# Patient Record
Sex: Female | Born: 1970 | Race: White | Hispanic: No | Marital: Married | Smoking: Former smoker
Health system: Southern US, Community
[De-identification: ages and names within clinical notes are randomized; demographics above are authoritative.]

## PROBLEM LIST (undated history)

## (undated) DIAGNOSIS — I10 Essential (primary) hypertension: Secondary | ICD-10-CM

## (undated) DIAGNOSIS — F419 Anxiety disorder, unspecified: Secondary | ICD-10-CM

## (undated) DIAGNOSIS — E079 Disorder of thyroid, unspecified: Secondary | ICD-10-CM

## (undated) HISTORY — PX: ABDOMINAL HYSTERECTOMY: SHX81

## (undated) HISTORY — PX: CHOLECYSTECTOMY: SHX55

## (undated) HISTORY — PX: AUGMENTATION MAMMAPLASTY: SUR837

## (undated) HISTORY — PX: DILATION AND CURETTAGE OF UTERUS: SHX78

---

## 2003-10-08 ENCOUNTER — Emergency Department (HOSPITAL_COMMUNITY): Admission: EM | Admit: 2003-10-08 | Discharge: 2003-10-09 | Payer: Self-pay | Admitting: Emergency Medicine

## 2004-10-26 ENCOUNTER — Ambulatory Visit: Payer: Self-pay | Admitting: Gastroenterology

## 2004-11-06 ENCOUNTER — Ambulatory Visit: Payer: Self-pay | Admitting: Gastroenterology

## 2004-11-06 ENCOUNTER — Encounter (INDEPENDENT_AMBULATORY_CARE_PROVIDER_SITE_OTHER): Payer: Self-pay | Admitting: *Deleted

## 2004-11-06 ENCOUNTER — Ambulatory Visit (HOSPITAL_COMMUNITY): Admission: RE | Admit: 2004-11-06 | Discharge: 2004-11-06 | Payer: Self-pay | Admitting: Gastroenterology

## 2004-11-08 ENCOUNTER — Encounter: Admission: RE | Admit: 2004-11-08 | Discharge: 2004-11-08 | Payer: Self-pay | Admitting: Gynecology

## 2005-05-15 ENCOUNTER — Ambulatory Visit: Payer: Self-pay | Admitting: Gynecology

## 2005-07-01 ENCOUNTER — Ambulatory Visit: Payer: Self-pay | Admitting: Gynecology

## 2007-10-17 ENCOUNTER — Observation Stay: Payer: Self-pay | Admitting: Obstetrics and Gynecology

## 2008-02-20 ENCOUNTER — Emergency Department: Payer: Self-pay | Admitting: Emergency Medicine

## 2008-05-15 ENCOUNTER — Observation Stay: Payer: Self-pay

## 2008-07-20 ENCOUNTER — Observation Stay: Payer: Self-pay | Admitting: Obstetrics and Gynecology

## 2008-07-25 ENCOUNTER — Observation Stay: Payer: Self-pay

## 2008-08-26 ENCOUNTER — Inpatient Hospital Stay: Payer: Self-pay

## 2010-02-01 ENCOUNTER — Ambulatory Visit (HOSPITAL_COMMUNITY)
Admission: RE | Admit: 2010-02-01 | Discharge: 2010-02-01 | Payer: Self-pay | Source: Home / Self Care | Attending: Family Medicine | Admitting: Family Medicine

## 2014-01-11 ENCOUNTER — Ambulatory Visit: Payer: Self-pay | Admitting: Obstetrics and Gynecology

## 2014-01-11 LAB — BASIC METABOLIC PANEL
Anion Gap: 5 — ABNORMAL LOW (ref 7–16)
BUN: 9 mg/dL (ref 7–18)
Calcium, Total: 8.3 mg/dL — ABNORMAL LOW (ref 8.5–10.1)
Chloride: 107 mmol/L (ref 98–107)
Co2: 28 mmol/L (ref 21–32)
Creatinine: 0.6 mg/dL (ref 0.60–1.30)
EGFR (African American): >60
EGFR (Non-African Amer.): >60
Glucose: 77 mg/dL (ref 65–99)
Osmolality: 277 (ref 275–301)
Potassium: 3.8 mmol/L (ref 3.5–5.1)
Sodium: 140 mmol/L (ref 136–145)

## 2014-01-11 LAB — HEMOGLOBIN: HGB: 9 g/dL — ABNORMAL LOW (ref 12.0–16.0)

## 2014-01-13 ENCOUNTER — Observation Stay: Payer: Self-pay | Admitting: Obstetrics and Gynecology

## 2014-01-17 ENCOUNTER — Ambulatory Visit: Payer: Self-pay | Admitting: Obstetrics and Gynecology

## 2014-01-17 LAB — CBC WITH DIFFERENTIAL/PLATELET
Basophil #: 0.1 10*3/uL (ref 0.0–0.1)
Basophil %: 1.1 %
Eosinophil #: 0.1 10*3/uL (ref 0.0–0.7)
Eosinophil %: 1.9 %
HCT: 33.3 % — ABNORMAL LOW (ref 35.0–47.0)
HGB: 10.1 g/dL — ABNORMAL LOW (ref 12.0–16.0)
Lymphocyte #: 1.8 10*3/uL (ref 1.0–3.6)
Lymphocyte %: 27.6 %
MCH: 20.7 pg — ABNORMAL LOW (ref 26.0–34.0)
MCHC: 30.3 g/dL — ABNORMAL LOW (ref 32.0–36.0)
MCV: 68 fL — ABNORMAL LOW (ref 80–100)
Monocyte #: 0.4 x10 3/mm (ref 0.2–0.9)
Monocyte %: 5.8 %
Neutrophil #: 4.1 10*3/uL (ref 1.4–6.5)
Neutrophil %: 63.6 %
Platelet: 247 10*3/uL (ref 150–440)
RBC: 4.88 10*6/uL (ref 3.80–5.20)
RDW: 20.9 % — ABNORMAL HIGH (ref 11.5–14.5)
WBC: 6.5 10*3/uL (ref 3.6–11.0)

## 2014-01-18 LAB — BASIC METABOLIC PANEL
Anion Gap: 5 — ABNORMAL LOW (ref 7–16)
BUN: 5 mg/dL — ABNORMAL LOW (ref 7–18)
Calcium, Total: 7.7 mg/dL — ABNORMAL LOW (ref 8.5–10.1)
Chloride: 110 mmol/L — ABNORMAL HIGH (ref 98–107)
Co2: 28 mmol/L (ref 21–32)
Creatinine: 0.66 mg/dL (ref 0.60–1.30)
EGFR (African American): >60
EGFR (Non-African Amer.): >60
Glucose: 94 mg/dL (ref 65–99)
Osmolality: 282 (ref 275–301)
Potassium: 3.7 mmol/L (ref 3.5–5.1)
Sodium: 143 mmol/L (ref 136–145)

## 2014-01-18 LAB — HEMATOCRIT: HCT: 31.6 % — ABNORMAL LOW (ref 35.0–47.0)

## 2014-02-08 ENCOUNTER — Ambulatory Visit: Payer: Self-pay | Admitting: Obstetrics and Gynecology

## 2014-05-12 ENCOUNTER — Emergency Department
Admit: 2014-05-12 | Disposition: A | Discharge status: Home / Self Care | Payer: Self-pay | Admitting: Emergency Medicine

## 2014-05-12 LAB — CBC
HCT: 42.1 % (ref 35.0–47.0)
HGB: 14 g/dL (ref 12.0–16.0)
MCH: 29.1 pg (ref 26.0–34.0)
MCHC: 33.3 g/dL (ref 32.0–36.0)
MCV: 87 fL (ref 80–100)
Platelet: 168 10*3/uL (ref 150–440)
RBC: 4.82 10*6/uL (ref 3.80–5.20)
RDW: 14.7 % — ABNORMAL HIGH (ref 11.5–14.5)
WBC: 6.8 10*3/uL (ref 3.6–11.0)

## 2014-05-12 LAB — URINALYSIS, COMPLETE
Bilirubin,UR: NEGATIVE
Glucose,UR: NEGATIVE mg/dL (ref 0–75)
Ketone: NEGATIVE
Leukocyte Esterase: NEGATIVE
Nitrite: NEGATIVE
Ph: 6 (ref 4.5–8.0)
Protein: NEGATIVE
Specific Gravity: 1.013 (ref 1.003–1.030)

## 2014-05-12 LAB — BASIC METABOLIC PANEL
Anion Gap: 6 — ABNORMAL LOW (ref 7–16)
BUN: 11 mg/dL
Calcium, Total: 8.8 mg/dL — ABNORMAL LOW
Chloride: 104 mmol/L
Co2: 27 mmol/L
Creatinine: 0.94 mg/dL
EGFR (African American): >60
EGFR (Non-African Amer.): >60
Glucose: 107 mg/dL — ABNORMAL HIGH
Potassium: 3.4 mmol/L — ABNORMAL LOW
Sodium: 137 mmol/L

## 2014-05-12 LAB — MAGNESIUM: Magnesium: 2.1 mg/dL

## 2014-05-12 LAB — TROPONIN I: Troponin-I: <0.03 ng/mL

## 2014-05-12 LAB — CK: CK, Total: 803 U/L — ABNORMAL HIGH

## 2014-05-23 LAB — SURGICAL PATHOLOGY

## 2014-05-25 NOTE — Op Note (Signed)
PATIENT NAME:  Colleen Phillips, Colleen Phillips MR#:  [id number] DATE OF BIRTH:  08/29/1970  DATE OF PROCEDURE:  01/17/2014  PREOPERATIVE DIAGNOSIS:  1.  Menorrhagia.  2.  Endometrial polyp.  3.  Anemia.   POSTOPERATIVE DIAGNOSES:  1.  Menorrhagia.  2.  Endometrial polyp.  3.  Anemia.   PROCEDURE:  1.  Total vaginal hysterectomy.  2.  Bilateral salpingectomy.  ANESTHESIA:  General endotracheal anesthesia.   SURGEON: Boykin Nearing, M.D.   FIRST ASSISTANT:  Dr. Leafy Ro.   INDICATIONS: This is a 44 year old, gravida 8, para three patient with a significant menorrhagia failing conservative treatment with IUD and birth control pills. The patient was noted to have a 2 x 1 cm endometrial polyp on saline infusion sonohysterography and elects for definitive treatment. The patient did receive 1 unit of blood transfusion three days prior to the procedure given her hemoglobin of 9.   PROCEDURE: After adequate general endotracheal anesthesia, the patient was placed in the dorsal supine position with legs in the candy cane stirrups. The patient's abdomen, perineum, and vagina were prepped and draped in normal sterile fashion. The patient did receive 2 grams IV cefoxitin prior to commencement of the case. Straight catheterization of the bladder yielded 100 mL clear urine. A weighted speculum was placed in the posterior vaginal vault. The cervix was grasped with two thyroid tenacula, and the cervix was circumferentially injected with 1% lidocaine with 1:100,000 epinephrine. A direct posterior colpotomy incision was made upon entry into the posterior cul-de-sac. The uterosacral ligaments were bilaterally clamped, transected, and suture ligated with 0 Vicryl suture and tagged for later identification. Anterior cervix was circumferentially incised with the Bovie. The anterior cul-de-sac was entered without difficulty and a Deaver retractor was placed within to elevate the bladder anteriorly. The cardinal ligaments were  bilaterally clamped, transected, and suture ligated with 0 Vicryl suture followed by bilaterally clamping the uterine arteries, transected, and suture ligated with 0 Vicryl suture. Serial bites in the broad ligament were performed, and suture ligated with 0 Vicryl suture. The central portion of the uterus was cored out given the width of the uterus and the cornua then were bilaterally clamped, transected, and suture ligated.  The uterus was delivered off the operative field with a noted endometrial polyp seen. Each fallopian tube was grasped with a Babcock clamp and clamped with a Heaney clamp and removed and suture ligated with 0 Vicryl suture. Ovaries appeared normal. Hemostasis was good and the peritoneum was then closed with a 2-0 PDS suture in a pursestring fashion and the vaginal cuff was then closed with 0 Vicryl suture. The uterosacral ligaments were plicated centrally and the rest of the vaginal vault was closed. Good hemostasis was noted. Foley catheter was placed at the end of the case yielding clear urine. There were no complications.   ESTIMATED BLOOD LOSS:  One hundred and twenty-five milliliters.  Intraoperative fluids: 900 mL, lactated Ringer's and the patient did receive 1 gram of IV Tylenol while still in the Operating Room.      ____________________________ Boykin Nearing, MD tjs:at D: 01/17/2014 10:00:55 ET T: 01/17/2014 12:14:27 ET JOB#: [id number]  cc: Boykin Nearing, MD, <Dictator> Boykin Nearing MD ELECTRONICALLY SIGNED 01/31/2014 12:15

## 2014-07-08 ENCOUNTER — Other Ambulatory Visit: Payer: Self-pay | Admitting: Otolaryngology

## 2014-07-08 DIAGNOSIS — R220 Localized swelling, mass and lump, head: Secondary | ICD-10-CM

## 2014-07-08 DIAGNOSIS — R221 Localized swelling, mass and lump, neck: Principal | ICD-10-CM

## 2014-07-20 ENCOUNTER — Other Ambulatory Visit: Payer: Self-pay | Admitting: Otolaryngology

## 2014-07-20 DIAGNOSIS — E041 Nontoxic single thyroid nodule: Secondary | ICD-10-CM

## 2014-07-20 DIAGNOSIS — R221 Localized swelling, mass and lump, neck: Principal | ICD-10-CM

## 2014-07-20 DIAGNOSIS — R220 Localized swelling, mass and lump, head: Secondary | ICD-10-CM

## 2014-07-21 ENCOUNTER — Ambulatory Visit
Admission: RE | Admit: 2014-07-21 | Discharge: 2014-07-21 | Disposition: A | Discharge status: Home / Self Care | Payer: Self-pay | Source: Ambulatory Visit | Attending: Otolaryngology | Admitting: Otolaryngology

## 2014-07-21 DIAGNOSIS — R221 Localized swelling, mass and lump, neck: Secondary | ICD-10-CM | POA: Insufficient documentation

## 2014-07-21 DIAGNOSIS — E041 Nontoxic single thyroid nodule: Secondary | ICD-10-CM

## 2014-07-21 DIAGNOSIS — R220 Localized swelling, mass and lump, head: Secondary | ICD-10-CM

## 2014-09-03 ENCOUNTER — Emergency Department: Payer: BC Managed Care – PPO

## 2014-09-03 ENCOUNTER — Emergency Department
Admission: EM | Admit: 2014-09-03 | Discharge: 2014-09-03 | Disposition: A | Discharge status: Home / Self Care | Payer: BC Managed Care – PPO | Attending: Emergency Medicine | Admitting: Emergency Medicine

## 2014-09-03 DIAGNOSIS — R52 Pain, unspecified: Secondary | ICD-10-CM

## 2014-09-03 DIAGNOSIS — Y92331 Roller skating rink as the place of occurrence of the external cause: Secondary | ICD-10-CM | POA: Insufficient documentation

## 2014-09-03 DIAGNOSIS — Y998 Other external cause status: Secondary | ICD-10-CM | POA: Diagnosis not present

## 2014-09-03 DIAGNOSIS — S76302A Unspecified injury of muscle, fascia and tendon of the posterior muscle group at thigh level, left thigh, initial encounter: Secondary | ICD-10-CM

## 2014-09-03 DIAGNOSIS — Y9351 Activity, roller skating (inline) and skateboarding: Secondary | ICD-10-CM | POA: Diagnosis not present

## 2014-09-03 DIAGNOSIS — T148XXA Other injury of unspecified body region, initial encounter: Secondary | ICD-10-CM

## 2014-09-03 DIAGNOSIS — S8992XA Unspecified injury of left lower leg, initial encounter: Secondary | ICD-10-CM | POA: Diagnosis present

## 2014-09-03 DIAGNOSIS — S76312A Strain of muscle, fascia and tendon of the posterior muscle group at thigh level, left thigh, initial encounter: Secondary | ICD-10-CM | POA: Insufficient documentation

## 2014-09-03 MED ORDER — OXYCODONE-ACETAMINOPHEN 5-325 MG PO TABS: 1.0000 | ORAL_TABLET | Freq: Four times a day (QID) | ORAL | Status: DC | PRN

## 2014-09-03 MED ORDER — IBUPROFEN 800 MG PO TABS: 800.0000 mg | ORAL_TABLET | Freq: Once | ORAL | Status: DC

## 2014-09-03 MED ORDER — IBUPROFEN 800 MG PO TABS: 800.0000 mg | ORAL_TABLET | Freq: Three times a day (TID) | ORAL | Status: DC | PRN

## 2014-09-03 MED ORDER — NAPROXEN 500 MG PO TABS: 500.0000 mg | ORAL_TABLET | Freq: Two times a day (BID) | ORAL | Status: AC

## 2014-09-03 MED ORDER — OXYCODONE-ACETAMINOPHEN 5-325 MG PO TABS
1.0000 | ORAL_TABLET | Freq: Once | ORAL | Status: AC
  Administered 2014-09-03: 1 via ORAL
  Filled 2014-09-03: qty 1

## 2014-09-03 NOTE — ED Notes (Signed)
Patient was skating on Thursday and did a split. Felt a pop in her left thigh. Has knot behind knee and under buttock. Painful in groin area. States swelling. States that if she steps a certain way, pain shoots up from ankle to knee.

## 2014-09-03 NOTE — ED Provider Notes (Signed)
[facility] Emergency Department Provider Note  ____________________________________________  Time seen: Approximately 8:18 PM  I have reviewed the triage vital signs and the nursing notes.   HISTORY  Chief Complaint Leg Pain    HPI GLYNNIS GAVEL is a 44 y.o. female who fell while rollerskating 2 nights ago injuring the back of her left leg. She heard a pop. She's had swelling and tingling in the left leg. No visible bruising at this point. No injury to the knee or ankle. Pain is primarily in the posterior thigh   No past medical history on file.  There are no active problems to display for this patient.   No past surgical history on file.  Current Outpatient Rx  Name  Route  Sig  Dispense  Refill  . naproxen (NAPROSYN) 500 MG tablet   Oral   Take 1 tablet (500 mg total) by mouth 2 (two) times daily with a meal.   40 tablet   2   . oxyCODONE-acetaminophen (ROXICET) 5-325 MG per tablet   Oral   Take 1 tablet by mouth every 6 (six) hours as needed.   20 tablet   0     Allergies Review of patient's allergies indicates not on file.  No family history on file.  Social History History  Substance Use Topics  . Smoking status: Not on file  . Smokeless tobacco: Not on file  . Alcohol Use: Not on file    Review of Systems Constitutional: No fever/chills Eyes: No visual changes. ENT: No sore throat. Cardiovascular: Denies chest pain. Respiratory: Denies shortness of breath. Gastrointestinal: No abdominal pain.  No nausea, no vomiting.  No diarrhea.  No constipation. Genitourinary: Negative for dysuria. Musculoskeletal: Negative for back pain. See above Skin: Negative for rash. Neurological: Negative for headaches, focal weakness or numbness.  10-point ROS otherwise negative.  ____________________________________________   PHYSICAL EXAM:  VITAL SIGNS: ED Triage Vitals  Enc Vitals Group     BP 09/03/14 1736 134/65 mmHg     Pulse  Rate 09/03/14 1734 76     Resp 09/03/14 1734 16     Temp 09/03/14 1734 98 F (36.7 C)     Temp Source 09/03/14 1734 Oral     SpO2 09/03/14 1734 99 %     Weight 09/03/14 1734 170 lb (77.111 kg)     Height 09/03/14 1734 5\' 6"  (1.676 m)     Head Cir --      Peak Flow --      Pain Score 09/03/14 1731 6     Pain Loc --      Pain Edu? --      Excl. in [place]? --     Constitutional: Alert and oriented. Well appearing and in no acute distress. Eyes: Conjunctivae are normal. PERRL. EOMI. Head: Atraumatic. Nose: No congestion/rhinnorhea. Mouth/Throat: Mucous membranes are moist.  Oropharynx non-erythematous. Neck: supple Cardiovascular: Normal rate, regular rhythm. Grossly normal heart sounds.  Good peripheral circulation. Respiratory: Normal respiratory effort.  No retractions. Lungs CTAB. Gastrointestinal: Soft and nontender. No distention. No abdominal bruits. No CVA tenderness. Musculoskeletal: Tenderness with swelling in the left posterior thigh, proximally and distally. No visible bruising noted. Pain with knee flexion and extension, in the posterior thigh. Tender in the left buttocks as well. Pain with internal and external rotation of the hip joint. Neurologic:  Normal speech and language. No gross focal neurologic deficits are appreciated. No gait instability. Skin:  Skin is warm, dry and intact. No  rash noted. Psychiatric: Mood and affect are normal. Speech and behavior are normal.  ____________________________________________   LABS (all labs ordered are listed, but only abnormal results are displayed)  Labs Reviewed - No data to display ____________________________________________  EKG    ____________________________________________  RADIOLOGY  EXAM: LEFT FEMUR 2 VIEWS  COMPARISON: None.  FINDINGS: There is no evidence of fracture or dislocation. The left femur appears grossly intact. The left femoral head remains seated at the acetabulum. The knee joint is  grossly unremarkable in appearance. No knee joint effusion is identified. Slight lucency noted within the proximal tibia on a single view is thought to be artifactual in nature.  The left sacroiliac joint is grossly unremarkable. No definite soft tissue abnormalities are characterized on radiograph.  IMPRESSION: No evidence of fracture or dislocation. Slight lucency within the proximal tibia on a single view is thought to be artifactual in nature. However, if the patient has significant focal knee symptoms, dedicated knee radiographs could be considered for further evaluation.   Electronically Signed  By: Garald Balding M.D.  On: 09/03/2014 19:49 ____________________________________________   PROCEDURES  Procedure(s) performed: None  Critical Care performed: No  ____________________________________________   INITIAL IMPRESSION / ASSESSMENT AND PLAN / ED COURSE  Pertinent labs & imaging results that were available during my care of the patient were reviewed by me and considered in my medical decision making (see chart for details).  44 year old female with injury to her left hamstring. Suspect a strain with possible significant tear. Muscle function is intact, though painful. Encouraged naproxen, ice, Ace wrap for compression, and use of crutches for support. She will elevate the leg. Follow-up with orthopedist next week. She is given naproxen and Percocet for pain.  ____________________________________________   FINAL CLINICAL IMPRESSION(S) / ED DIAGNOSES  Final diagnoses:  Hamstring injury, left, initial encounter  Muscle strain      Mortimer Fries, PA-C 09/03/14 2024  Carrie Mew, MD 09/03/14 [phone]

## 2014-09-03 NOTE — ED Notes (Signed)
Patient to ED with c/o left upper leg pain, reports roller skating Thursday and fell doing a complete split. Reports since that time the pain has been significant.

## 2014-09-03 NOTE — Discharge Instructions (Signed)
Muscle Strain A muscle strain is an injury that occurs when a muscle is stretched beyond its normal length. Usually a small number of muscle fibers are torn when this happens. Muscle strain is rated in degrees. First-degree strains have the least amount of muscle fiber tearing and pain. Second-degree and third-degree strains have increasingly more tearing and pain.  Usually, recovery from muscle strain takes 1-2 weeks. Complete healing takes 5-6 weeks.  CAUSES  Muscle strain happens when a sudden, violent force placed on a muscle stretches it too far. This may occur with lifting, sports, or a fall.  RISK FACTORS Muscle strain is especially common in athletes.  SIGNS AND SYMPTOMS At the site of the muscle strain, there may be:  Pain.  Bruising.  Swelling.  Difficulty using the muscle due to pain or lack of normal function. DIAGNOSIS  Your health care provider will perform a physical exam and ask about your medical history. TREATMENT  Often, the best treatment for a muscle strain is resting, icing, and applying cold compresses to the injured area.  HOME CARE INSTRUCTIONS   Use the PRICE method of treatment to promote muscle healing during the first 2-3 days after your injury. The PRICE method involves:  Protecting the muscle from being injured again.  Restricting your activity and resting the injured body part.  Icing your injury. To do this, put ice in a plastic bag. Place a towel between your skin and the bag. Then, apply the ice and leave it on from 15-20 minutes each hour. After the third day, switch to moist heat packs.  Apply compression to the injured area with a splint or elastic bandage. Be careful not to wrap it too tightly. This may interfere with blood circulation or increase swelling.  Elevate the injured body part above the level of your heart as often as you can.  Only take over-the-counter or prescription medicines for pain, discomfort, or fever as directed by your  health care provider.  Warming up prior to exercise helps to prevent future muscle strains. SEEK MEDICAL CARE IF:   You have increasing pain or swelling in the injured area.  You have numbness, tingling, or a significant loss of strength in the injured area. MAKE SURE YOU:   Understand these instructions.  Will watch your condition.  Will get help right away if you are not doing well or get worse. Document Released: 01/14/2005 Document Revised: 11/04/2012 Document Reviewed: 08/13/2012 [facility] Patient Information 2015 [place], [place]. This information is not intended to replace advice given to you by your health care provider. Make sure you discuss any questions you have with your health care provider.   Continue the Ace wrap and ice to the area. Use pain medicine as directed. Keep area elevated. Contact the orthopedist next week for follow-up appointment.

## 2015-01-06 ENCOUNTER — Other Ambulatory Visit: Payer: Self-pay | Admitting: Family Medicine

## 2015-01-08 NOTE — Telephone Encounter (Signed)
Refilled for 30 days. Must schedule recheck appointment before further refills needed.

## 2015-01-09 NOTE — Telephone Encounter (Signed)
Left patient a voicemail advising her that refill has been sent to pharmacy, and to call back to schedule a follow up appointment for further refills.

## 2016-03-15 ENCOUNTER — Emergency Department: Payer: BC Managed Care – PPO

## 2016-03-15 ENCOUNTER — Emergency Department
Admission: EM | Admit: 2016-03-15 | Discharge: 2016-03-16 | Disposition: A | Discharge status: Home / Self Care | Payer: BC Managed Care – PPO | Attending: Student in an Organized Health Care Education/Training Program | Admitting: Student in an Organized Health Care Education/Training Program

## 2016-03-15 ENCOUNTER — Encounter: Payer: Self-pay | Admitting: Emergency Medicine

## 2016-03-15 DIAGNOSIS — N200 Calculus of kidney: Secondary | ICD-10-CM | POA: Insufficient documentation

## 2016-03-15 DIAGNOSIS — I10 Essential (primary) hypertension: Secondary | ICD-10-CM | POA: Insufficient documentation

## 2016-03-15 DIAGNOSIS — F17200 Nicotine dependence, unspecified, uncomplicated: Secondary | ICD-10-CM | POA: Insufficient documentation

## 2016-03-15 DIAGNOSIS — R1031 Right lower quadrant pain: Secondary | ICD-10-CM

## 2016-03-15 DIAGNOSIS — N201 Calculus of ureter: Secondary | ICD-10-CM

## 2016-03-15 HISTORY — DX: Essential (primary) hypertension: I10

## 2016-03-15 HISTORY — DX: Anxiety disorder, unspecified: F41.9

## 2016-03-15 HISTORY — DX: Disorder of thyroid, unspecified: E07.9

## 2016-03-15 LAB — BASIC METABOLIC PANEL
Anion gap: 9 (ref 5–15)
BUN: 11 mg/dL (ref 6–20)
CO2: 25 mmol/L (ref 22–32)
Calcium: 9 mg/dL (ref 8.9–10.3)
Chloride: 107 mmol/L (ref 101–111)
Creatinine, Ser: 0.59 mg/dL (ref 0.44–1.00)
GFR calc Af Amer: >60 mL/min (ref >60)
GFR calc non Af Amer: >60 mL/min (ref >60)
Glucose, Bld: 127 mg/dL — ABNORMAL HIGH (ref 65–99)
Potassium: 3.6 mmol/L (ref 3.5–5.1)
Sodium: 141 mmol/L (ref 135–145)

## 2016-03-15 LAB — CBC
HCT: 43.2 % (ref 35.0–47.0)
HEMOGLOBIN: 15.1 g/dL (ref 12.0–16.0)
MCH: 30.7 pg (ref 26.0–34.0)
MCHC: 35 g/dL (ref 32.0–36.0)
MCV: 87.8 fL (ref 80.0–100.0)
Platelets: 199 10*3/uL (ref 150–440)
RBC: 4.91 MIL/uL (ref 3.80–5.20)
RDW: 13.7 % (ref 11.5–14.5)
WBC: 11 10*3/uL (ref 3.6–11.0)

## 2016-03-15 LAB — URINALYSIS, COMPLETE (UACMP) WITH MICROSCOPIC
Bilirubin Urine: NEGATIVE
Glucose, UA: NEGATIVE mg/dL
Ketones, ur: NEGATIVE mg/dL
Leukocytes, UA: NEGATIVE
Nitrite: POSITIVE — AB
Protein, ur: NEGATIVE mg/dL
Specific Gravity, Urine: 1.019 (ref 1.005–1.030)
pH: 5 (ref 5.0–8.0)

## 2016-03-15 MED ORDER — KETOROLAC TROMETHAMINE 30 MG/ML IJ SOLN
30.0000 mg | Freq: Once | INTRAMUSCULAR | Status: AC
  Administered 2016-03-15: 30 mg via INTRAVENOUS
  Filled 2016-03-15: qty 1

## 2016-03-15 MED ORDER — DEXTROSE 5 % IV SOLN: 1.0000 g | Freq: Once | INTRAVENOUS | Status: DC

## 2016-03-15 MED ORDER — PROMETHAZINE HCL 12.5 MG PO TABS: 12.5000 mg | ORAL_TABLET | Freq: Four times a day (QID) | ORAL | 0 refills | Status: DC | PRN

## 2016-03-15 MED ORDER — HYDROCODONE-ACETAMINOPHEN 5-325 MG PO TABS: 1.0000 | ORAL_TABLET | ORAL | 0 refills | Status: DC | PRN

## 2016-03-15 MED ORDER — ONDANSETRON HCL 4 MG/2ML IJ SOLN
4.0000 mg | Freq: Once | INTRAMUSCULAR | Status: AC
  Administered 2016-03-15: 4 mg via INTRAVENOUS
  Filled 2016-03-15: qty 2

## 2016-03-15 MED ORDER — CEPHALEXIN 500 MG PO CAPS: 500.0000 mg | ORAL_CAPSULE | Freq: Three times a day (TID) | ORAL | 0 refills | Status: AC

## 2016-03-15 MED ORDER — MORPHINE SULFATE (PF) 4 MG/ML IV SOLN
4.0000 mg | INTRAVENOUS | Status: DC | PRN
  Administered 2016-03-15 (×2): 4 mg via INTRAVENOUS
  Filled 2016-03-15 (×2): qty 1

## 2016-03-15 MED ORDER — CEFTRIAXONE SODIUM-DEXTROSE 1-3.74 GM-% IV SOLR
1.0000 g | Freq: Once | INTRAVENOUS | Status: AC
  Administered 2016-03-15: 1 g via INTRAVENOUS
  Filled 2016-03-15: qty 50

## 2016-03-15 MED ORDER — TAMSULOSIN HCL 0.4 MG PO CAPS: 0.4000 mg | ORAL_CAPSULE | Freq: Every day | ORAL | 0 refills | Status: DC

## 2016-03-15 MED ORDER — NAPROXEN 375 MG PO TABS: 375.0000 mg | ORAL_TABLET | Freq: Two times a day (BID) | ORAL | 0 refills | Status: AC

## 2016-03-15 MED ORDER — PROMETHAZINE HCL 25 MG/ML IJ SOLN: 12.5000 mg | Freq: Once | INTRAMUSCULAR | Status: DC

## 2016-03-15 MED ORDER — SODIUM CHLORIDE 0.9 % IV BOLUS (SEPSIS)
1000.0000 mL | Freq: Once | INTRAVENOUS | Status: AC
  Administered 2016-03-15: 1000 mL via INTRAVENOUS

## 2016-03-15 MED ORDER — FENTANYL CITRATE (PF) 100 MCG/2ML IJ SOLN
50.0000 ug | INTRAMUSCULAR | Status: DC | PRN
  Administered 2016-03-15: 50 ug via INTRAVENOUS
  Filled 2016-03-15: qty 2

## 2016-03-15 MED ORDER — IOPAMIDOL (ISOVUE-300) INJECTION 61%
100.0000 mL | Freq: Once | INTRAVENOUS | Status: AC | PRN
  Administered 2016-03-15: 100 mL via INTRAVENOUS

## 2016-03-15 NOTE — ED Provider Notes (Signed)
[facility] Emergency Department Provider Note    First MD Initiated Contact with Patient 03/15/16 2028     (approximate)  I have reviewed the triage vital signs and the nursing notes.   HISTORY  Chief Complaint Pelvic Pain    HPI Colleen Phillips is a 46 y.o. female presents with acute right lower quadrant pain radiating into her suprapubic area. States that he's been having symptoms of a UTI for the past week. Called a tele-physician who prescribed Macrobid but the symptoms have not improved since then. States she does have a history of kidney stone but this feels different. Is status post hysterectomy but still has her appendix as far she knows. States that the pain has become more severe today associated with chills but no nausea or vomiting. Denies any dysuria. No change in color. Denies any measured fevers.   Past Medical History:  Diagnosis Date  . Anxiety   . Hypertension   . Thyroid disease    No family history on file. Past Surgical History:  Procedure Laterality Date  . ABDOMINAL HYSTERECTOMY    . CHOLECYSTECTOMY    . DILATION AND CURETTAGE OF UTERUS     There are no active problems to display for this patient.     Prior to Admission medications   Medication Sig Start Date End Date Taking? Authorizing Provider  metoprolol succinate (TOPROL-XL) 25 MG 24 hr tablet TAKE ONE TABLET BY MOUTH ONCE DAILY 01/08/15   Vickki Muff Chrismon, PA  oxyCODONE-acetaminophen (ROXICET) 5-325 MG per tablet Take 1 tablet by mouth every 6 (six) hours as needed. 09/03/14   Mortimer Fries, PA-C    Allergies Doxycycline    Social History Social History  Substance Use Topics  . Smoking status: Current Some Day Smoker  . Smokeless tobacco: Never Used  . Alcohol use Yes     Comment: Social     Review of Systems Patient denies headaches, rhinorrhea, blurry vision, numbness, shortness of breath, chest pain, edema, cough, abdominal pain, nausea, vomiting,  diarrhea, dysuria, fevers, rashes or hallucinations unless otherwise stated above in HPI. ____________________________________________   PHYSICAL EXAM:  VITAL SIGNS: Vitals:   03/15/16 2011 03/15/16 2035  BP: (!) 133/107 137/83  Pulse: 98 89  Resp: 20 18  Temp: 98.5 F (36.9 C)     Constitutional: Alert and oriented. Well appearing and in no acute distress. Eyes: Conjunctivae are normal. PERRL. EOMI. Head: Atraumatic. Nose: No congestion/rhinnorhea. Mouth/Throat: Mucous membranes are moist.  Oropharynx non-erythematous. Neck: No stridor. Painless ROM. No cervical spine tenderness to palpation Hematological/Lymphatic/Immunilogical: No cervical lymphadenopathy. Cardiovascular: Normal rate, regular rhythm. Grossly normal heart sounds.  Good peripheral circulation. Respiratory: Normal respiratory effort.  No retractions. Lungs CTAB. Gastrointestinal: Soft with acute ttp in RLQ with rebound tenderness.No distention. No abdominal bruits. No CVA tenderness. Musculoskeletal: No lower extremity tenderness nor edema.  No joint effusions. Neurologic:  Normal speech and language. No gross focal neurologic deficits are appreciated. No gait instability. Skin:  Skin is warm, dry and intact. No rash noted. Psychiatric: Mood and affect are normal. Speech and behavior are normal.  ____________________________________________   LABS (all labs ordered are listed, but only abnormal results are displayed)  Results for orders placed or performed during the hospital encounter of 03/15/16 (from the past 24 hour(s))  Urinalysis, Complete w Microscopic     Status: Abnormal   Collection Time: 03/15/16  8:18 PM  Result Value Ref Range   Color, Urine AMBER (A) YELLOW  APPearance CLEAR (A) CLEAR   Specific Gravity, Urine 1.019 1.005 - 1.030   pH 5.0 5.0 - 8.0   Glucose, UA NEGATIVE NEGATIVE mg/dL   Hgb urine dipstick MODERATE (A) NEGATIVE   Bilirubin Urine NEGATIVE NEGATIVE   Ketones, ur NEGATIVE  NEGATIVE mg/dL   Protein, ur NEGATIVE NEGATIVE mg/dL   Nitrite POSITIVE (A) NEGATIVE   Leukocytes, UA NEGATIVE NEGATIVE   RBC / HPF TOO NUMEROUS TO COUNT 0 - 5 RBC/hpf   WBC, UA 0-5 0 - 5 WBC/hpf   Bacteria, UA FEW (A) NONE SEEN   Squamous Epithelial / LPF 0-5 (A) NONE SEEN   Mucous PRESENT   Basic metabolic panel     Status: Abnormal   Collection Time: 03/15/16  8:27 PM  Result Value Ref Range   Sodium 141 135 - 145 mmol/L   Potassium 3.6 3.5 - 5.1 mmol/L   Chloride 107 101 - 111 mmol/L   CO2 25 22 - 32 mmol/L   Glucose, Bld 127 (H) 65 - 99 mg/dL   BUN 11 6 - 20 mg/dL   Creatinine, Ser 0.59 0.44 - 1.00 mg/dL   Calcium 9.0 8.9 - 10.3 mg/dL   GFR calc non Af Amer >60 >60 mL/min   GFR calc Af Amer >60 >60 mL/min   Anion gap 9 5 - 15  CBC     Status: None   Collection Time: 03/15/16  8:27 PM  Result Value Ref Range   WBC 11.0 3.6 - 11.0 K/uL   RBC 4.91 3.80 - 5.20 MIL/uL   Hemoglobin 15.1 12.0 - 16.0 g/dL   HCT 43.2 35.0 - 47.0 %   MCV 87.8 80.0 - 100.0 fL   MCH 30.7 26.0 - 34.0 pg   MCHC 35.0 32.0 - 36.0 g/dL   RDW 13.7 11.5 - 14.5 %   Platelets 199 150 - 440 K/uL   ____________________________________________  EKG_________________________________  RADIOLOGY  I personally reviewed all radiographic images ordered to evaluate for the above acute complaints and reviewed radiology reports and findings.  These findings were personally discussed with the patient.  Please see medical record for radiology report.  ____________________________________________   PROCEDURES  Procedure(s) performed:  Procedures    Critical Care performed: no ____________________________________________   INITIAL IMPRESSION / ASSESSMENT AND PLAN / ED COURSE  Pertinent labs & imaging results that were available during my care of the patient were reviewed by me and considered in my medical decision making (see chart for details).  DDX: appy, stone, pyelo, uti, cystitis, urethritis,  hernia, torsion  Colleen Phillips is a 46 y.o. who presents to the ED with acute right lower quadrant pain as described above. Patient afebrile and hemodynamically stable. Urinalysis does show evidence of blood with nitrate positive concerning for UTI the patient denies any dysuria and has been on Macrobid. She does have acute right lower quadrant tenderness palpation and I am concerned for appendicitis therefore will order CT imaging of further evaluate. We'll provide IV pain medication and IV fluids.  The patient will be placed on continuous pulse oximetry and telemetry for monitoring.  Laboratory evaluation will be sent to evaluate for the above complaints.     Clinical Course as of Mar 16 2307  Fri Mar 15, 2016  2304 Patient reassessed. Remains afebrile. Otherwise hemodynamic stable. There is no evidence of hydronephrosis or obstruction on her CT scan. Urinalysis on further review only nitrite positive and in the setting of taking Pyridium may be falsely positive. She  again she denies any dysuria or fevers therefore a lower suspicion for septic stone but will continue with therapy with antibiotics that she was recently treated with Macrobid. Patient able to tolerate oral hydration and otherwise hemodynamic stable. A spoke with Dr. Roni Bread of urology who agrees that there is no indication for emergent intervention and that she is likely secondary to nephrolithiasis. Did recommend admission for pain control with the patient states she preferred discharge home. She is otherwise hemodynamically stable I do feel she is appropriate for close outpatient follow-up.  Have discussed with the patient and available family all diagnostics and treatments performed thus far and all questions were answered to the best of my ability. The patient demonstrates understanding and agreement with plan.   [PR]    Clinical Course User Index [PR] Merlyn Lot, MD     ____________________________________________   FINAL  CLINICAL IMPRESSION(S) / ED DIAGNOSES  Final diagnoses:  Ureterolithiasis  RLQ abdominal pain      NEW MEDICATIONS STARTED DURING THIS VISIT:  New Prescriptions   No medications on file     Note:  This document was prepared using Dragon voice recognition software and may include unintentional dictation errors.    Merlyn Lot, MD 03/15/16 [phone]

## 2016-03-15 NOTE — ED Notes (Signed)
Patient given crackers and water for PO challenge. Will continue to monitor.

## 2016-03-15 NOTE — ED Triage Notes (Signed)
Patient presents to ED via POV with c/o pelvic pain. Patient points to right groin. Patient states, "There is so much pressure. It's like I am having a baby. I want to push something out. Its like a contraction". Hx of hysterotomy. Patient taking microbid from "a doctor on demand". Patient denies dysuria.

## 2016-03-16 NOTE — ED Notes (Signed)
Pt tolerated PO challenge without difficulty.  

## 2016-03-17 LAB — URINE CULTURE: Culture: <10000 — AB

## 2017-04-09 ENCOUNTER — Other Ambulatory Visit: Payer: Self-pay | Admitting: Internal Medicine

## 2017-04-09 DIAGNOSIS — R002 Palpitations: Secondary | ICD-10-CM

## 2017-04-09 DIAGNOSIS — I208 Other forms of angina pectoris: Secondary | ICD-10-CM

## 2017-04-10 ENCOUNTER — Other Ambulatory Visit: Payer: Self-pay | Admitting: Internal Medicine

## 2017-04-10 DIAGNOSIS — R002 Palpitations: Secondary | ICD-10-CM

## 2017-04-10 DIAGNOSIS — I208 Other forms of angina pectoris: Secondary | ICD-10-CM

## 2017-04-14 ENCOUNTER — Ambulatory Visit
Admission: RE | Admit: 2017-04-14 | Discharge: 2017-04-14 | Disposition: A | Discharge status: Home / Self Care | Payer: BC Managed Care – PPO | Source: Ambulatory Visit | Attending: Internal Medicine | Admitting: Internal Medicine

## 2017-04-14 DIAGNOSIS — R002 Palpitations: Secondary | ICD-10-CM | POA: Insufficient documentation

## 2017-04-14 DIAGNOSIS — R011 Cardiac murmur, unspecified: Secondary | ICD-10-CM | POA: Insufficient documentation

## 2017-04-14 DIAGNOSIS — I10 Essential (primary) hypertension: Secondary | ICD-10-CM | POA: Insufficient documentation

## 2017-04-14 DIAGNOSIS — E079 Disorder of thyroid, unspecified: Secondary | ICD-10-CM | POA: Insufficient documentation

## 2017-04-14 DIAGNOSIS — Z7689 Persons encountering health services in other specified circumstances: Secondary | ICD-10-CM | POA: Insufficient documentation

## 2017-04-14 DIAGNOSIS — I208 Other forms of angina pectoris: Secondary | ICD-10-CM | POA: Insufficient documentation

## 2017-04-14 DIAGNOSIS — F419 Anxiety disorder, unspecified: Secondary | ICD-10-CM | POA: Insufficient documentation

## 2017-04-14 NOTE — Progress Notes (Signed)
*  PRELIMINARY RESULTS* Echocardiogram 2D Echocardiogram has been performed.  Colleen Phillips 04/14/2017, 12:01 PM

## 2017-08-18 ENCOUNTER — Encounter: Payer: Self-pay | Admitting: Intensive Care

## 2017-08-18 ENCOUNTER — Emergency Department
Admission: EM | Admit: 2017-08-18 | Discharge: 2017-08-18 | Disposition: A | Discharge status: Home / Self Care | Payer: Self-pay | Attending: Emergency Medicine | Admitting: Emergency Medicine

## 2017-08-18 ENCOUNTER — Emergency Department: Payer: Self-pay

## 2017-08-18 ENCOUNTER — Other Ambulatory Visit: Payer: Self-pay

## 2017-08-18 DIAGNOSIS — Z79899 Other long term (current) drug therapy: Secondary | ICD-10-CM | POA: Insufficient documentation

## 2017-08-18 DIAGNOSIS — R1032 Left lower quadrant pain: Secondary | ICD-10-CM | POA: Insufficient documentation

## 2017-08-18 DIAGNOSIS — I10 Essential (primary) hypertension: Secondary | ICD-10-CM | POA: Insufficient documentation

## 2017-08-18 DIAGNOSIS — N83209 Unspecified ovarian cyst, unspecified side: Secondary | ICD-10-CM

## 2017-08-18 DIAGNOSIS — N83292 Other ovarian cyst, left side: Secondary | ICD-10-CM | POA: Insufficient documentation

## 2017-08-18 DIAGNOSIS — Z87891 Personal history of nicotine dependence: Secondary | ICD-10-CM | POA: Insufficient documentation

## 2017-08-18 LAB — CBC
HEMATOCRIT: 44.3 % (ref 35.0–47.0)
Hemoglobin: 15.5 g/dL (ref 12.0–16.0)
MCH: 31.2 pg (ref 26.0–34.0)
MCHC: 35.1 g/dL (ref 32.0–36.0)
MCV: 89 fL (ref 80.0–100.0)
Platelets: 221 10*3/uL (ref 150–440)
RBC: 4.98 MIL/uL (ref 3.80–5.20)
RDW: 13.7 % (ref 11.5–14.5)
WBC: 9 10*3/uL (ref 3.6–11.0)

## 2017-08-18 LAB — URINALYSIS, COMPLETE (UACMP) WITH MICROSCOPIC
BILIRUBIN URINE: NEGATIVE
Glucose, UA: NEGATIVE mg/dL
Hgb urine dipstick: NEGATIVE
Ketones, ur: NEGATIVE mg/dL
Leukocytes, UA: NEGATIVE
NITRITE: NEGATIVE
Protein, ur: NEGATIVE mg/dL
Specific Gravity, Urine: 1.003 — ABNORMAL LOW (ref 1.005–1.030)
pH: 7 (ref 5.0–8.0)

## 2017-08-18 LAB — BASIC METABOLIC PANEL
ANION GAP: 4 — AB (ref 5–15)
BUN: 8 mg/dL (ref 6–20)
CHLORIDE: 107 mmol/L (ref 98–111)
CO2: 27 mmol/L (ref 22–32)
Calcium: 9 mg/dL (ref 8.9–10.3)
Creatinine, Ser: 0.53 mg/dL (ref 0.44–1.00)
GLUCOSE: 98 mg/dL (ref 70–99)
Potassium: 3.9 mmol/L (ref 3.5–5.1)
SODIUM: 138 mmol/L (ref 135–145)

## 2017-08-18 MED ORDER — KETOROLAC TROMETHAMINE 30 MG/ML IJ SOLN
15.0000 mg | Freq: Once | INTRAMUSCULAR | Status: AC
  Administered 2017-08-18: 15 mg via INTRAVENOUS

## 2017-08-18 MED ORDER — KETOROLAC TROMETHAMINE 30 MG/ML IJ SOLN
INTRAMUSCULAR | Status: AC
  Administered 2017-08-18: 15 mg via INTRAVENOUS
  Filled 2017-08-18: qty 1

## 2017-08-18 MED ORDER — FENTANYL CITRATE (PF) 100 MCG/2ML IJ SOLN
50.0000 ug | INTRAMUSCULAR | Status: DC | PRN
  Administered 2017-08-18: 50 ug via INTRAVENOUS
  Filled 2017-08-18: qty 2

## 2017-08-18 NOTE — Discharge Instructions (Addendum)
Follow-up with your OB/GYN in 1 week.  Return to the emergency room if you have severe lower abdominal pain as this may be concerning for an ovarian torsion.

## 2017-08-18 NOTE — ED Provider Notes (Signed)
[facility] Emergency Department Provider Note  ____________________________________________  Time seen: Approximately 5:45 PM  I have reviewed the triage vital signs and the nursing notes.   HISTORY  Chief Complaint Flank Pain (left side)   HPI Colleen Phillips is a 47 y.o. female history of hypothyroidism, hypertension, anxiety, kidney stones who presents for evaluation of left flank pain.  Patient reports 2 weeks of cloudy foul-smelling urine.  Today she started having pain that she describes as sharp, intermittent, located in the left flank and radiating to the left lower abdomen associated with nausea. Currently pain is 5/10. No vomiting, no dysuria, no hematuria, no fever, no chills, no chest pain or shortness of breath, no vaginal discharge.  Patient has had a cholecystectomy and hysterectomy in the past but no other abdominal surgeries.  She reports that the pain is similar to prior kidney stone which she was able to pass without intervention.  Past Medical History:  Diagnosis Date  . Anxiety   . Hypertension   . Thyroid disease     Past Surgical History:  Procedure Laterality Date  . ABDOMINAL HYSTERECTOMY    . CHOLECYSTECTOMY    . DILATION AND CURETTAGE OF UTERUS      Prior to Admission medications   Medication Sig Start Date End Date Taking? Authorizing Provider  HYDROcodone-acetaminophen (NORCO) 5-325 MG tablet Take 1 tablet by mouth every 4 (four) hours as needed for moderate pain. 03/15/16   Merlyn Lot, MD  metoprolol succinate (TOPROL-XL) 25 MG 24 hr tablet TAKE ONE TABLET BY MOUTH ONCE DAILY 01/08/15   Chrismon, Vickki Muff, PA  oxyCODONE-acetaminophen (ROXICET) 5-325 MG per tablet Take 1 tablet by mouth every 6 (six) hours as needed. 09/03/14   Mortimer Fries, PA-C  promethazine (PHENERGAN) 12.5 MG tablet Take 1 tablet (12.5 mg total) by mouth every 6 (six) hours as needed for nausea or vomiting. 03/15/16   Merlyn Lot, MD  tamsulosin  (FLOMAX) 0.4 MG CAPS capsule Take 1 capsule (0.4 mg total) by mouth daily after supper. 03/15/16   Merlyn Lot, MD    Allergies Doxycycline  History reviewed. No pertinent family history.  Social History Social History   Tobacco Use  . Smoking status: Former Research scientist (life sciences)  . Smokeless tobacco: Never Used  Substance Use Topics  . Alcohol use: Yes    Comment: Social   . Drug use: No    Review of Systems  Constitutional: Negative for fever. Eyes: Negative for visual changes. ENT: Negative for sore throat. Neck: No neck pain  Cardiovascular: Negative for chest pain. Respiratory: Negative for shortness of breath. Gastrointestinal: Negative for abdominal pain, vomiting or diarrhea. Genitourinary: Negative for dysuria. + foul smelling urine and L flank pain Musculoskeletal: Negative for back pain. Skin: Negative for rash. Neurological: Negative for headaches, weakness or numbness. Psych: No SI or HI  ____________________________________________   PHYSICAL EXAM:  VITAL SIGNS: ED Triage Vitals [08/18/17 1655]  Enc Vitals Group     BP (!) 154/93     Pulse Rate 94     Resp 20     Temp 98.6 F (37 C)     Temp Source Oral     SpO2 97 %     Weight 147 lb (66.7 kg)     Height 5\' 7"  (1.702 m)     Head Circumference      Peak Flow      Pain Score 10     Pain Loc      Pain  Edu?      Excl. in [place]?     Constitutional: Alert and oriented. Well appearing and in no apparent distress. HEENT:      Head: Normocephalic and atraumatic.         Eyes: Conjunctivae are normal. Sclera is non-icteric.       Mouth/Throat: Mucous membranes are moist.       Neck: Supple with no signs of meningismus. Cardiovascular: Regular rate and rhythm. No murmurs, gallops, or rubs. 2+ symmetrical distal pulses are present in all extremities. No JVD. Respiratory: Normal respiratory effort. Lungs are clear to auscultation bilaterally. No wheezes, crackles, or rhonchi.  Gastrointestinal: Soft, LLQ  tenderness to palpation, and non distended with positive bowel sounds. No rebound or guarding. Genitourinary: No CVA tenderness. Musculoskeletal: Nontender with normal range of motion in all extremities. No edema, cyanosis, or erythema of extremities. Neurologic: Normal speech and language. Face is symmetric. Moving all extremities. No gross focal neurologic deficits are appreciated. Skin: Skin is warm, dry and intact. No rash noted. Psychiatric: Mood and affect are normal. Speech and behavior are normal.  ____________________________________________   LABS (all labs ordered are listed, but only abnormal results are displayed)  Labs Reviewed  URINALYSIS, COMPLETE (UACMP) WITH MICROSCOPIC - Abnormal; Notable for the following components:      Result Value   Color, Urine STRAW (*)    APPearance CLEAR (*)    Specific Gravity, Urine 1.003 (*)    Bacteria, UA RARE (*)    All other components within normal limits  BASIC METABOLIC PANEL - Abnormal; Notable for the following components:   Anion gap 4 (*)    All other components within normal limits  URINE CULTURE  CBC   ____________________________________________  EKG  none  ____________________________________________  RADIOLOGY  I have personally reviewed the images performed during this visit and I agree with the Radiologist's read.   Interpretation by Radiologist:  US Pelvis Transvanginal Non-ob (tv Only)  Result Date: 08/18/2017 CLINICAL DATA:  Left lower quadrant pain EXAM: TRANSABDOMINAL AND TRANSVAGINAL ULTRASOUND OF PELVIS DOPPLER ULTRASOUND OF OVARIES TECHNIQUE: Both transabdominal and transvaginal ultrasound examinations of the pelvis were performed. Transabdominal technique was performed for global imaging of the pelvis including uterus, ovaries, adnexal regions, and pelvic cul-de-sac. It was necessary to proceed with endovaginal exam following the transabdominal exam to visualize the ovaries and adnexa. Color and duplex  Doppler ultrasound was utilized to evaluate blood flow to the ovaries. COMPARISON:  CT 08/18/2017 FINDINGS: Uterus Status post hysterectomy Endometrium Status post hysterectomy Right ovary Measurements: 2.7 x 1.8 x 1.6 cm. Normal appearance/no adnexal mass. Left ovary Measurements: 3.2 x 3.1 x 2.8 cm. Hemorrhagic cyst within the left ovary measuring 2.5 x 2 x 2.5 cm. Pulsed Doppler evaluation of both ovaries demonstrates normal low-resistance arterial and venous waveforms. Other findings No abnormal free fluid. IMPRESSION: 1. Status post hysterectomy. 2. Negative for ovarian torsion 3. 2.5 cm complex cyst in the left ovary with imaging features of a hemorrhagic cyst. Electronically Signed   By: Donavan Foil M.D.   On: 08/18/2017 20:06   US Pelvis (transabdominal Only)  Result Date: 08/18/2017 CLINICAL DATA:  Left lower quadrant pain EXAM: TRANSABDOMINAL AND TRANSVAGINAL ULTRASOUND OF PELVIS DOPPLER ULTRASOUND OF OVARIES TECHNIQUE: Both transabdominal and transvaginal ultrasound examinations of the pelvis were performed. Transabdominal technique was performed for global imaging of the pelvis including uterus, ovaries, adnexal regions, and pelvic cul-de-sac. It was necessary to proceed with endovaginal exam following the transabdominal exam to visualize the  ovaries and adnexa. Color and duplex Doppler ultrasound was utilized to evaluate blood flow to the ovaries. COMPARISON:  CT 08/18/2017 FINDINGS: Uterus Status post hysterectomy Endometrium Status post hysterectomy Right ovary Measurements: 2.7 x 1.8 x 1.6 cm. Normal appearance/no adnexal mass. Left ovary Measurements: 3.2 x 3.1 x 2.8 cm. Hemorrhagic cyst within the left ovary measuring 2.5 x 2 x 2.5 cm. Pulsed Doppler evaluation of both ovaries demonstrates normal low-resistance arterial and venous waveforms. Other findings No abnormal free fluid. IMPRESSION: 1. Status post hysterectomy. 2. Negative for ovarian torsion 3. 2.5 cm complex cyst in the left ovary  with imaging features of a hemorrhagic cyst. Electronically Signed   By: Donavan Foil M.D.   On: 08/18/2017 20:06   US Pelvic Doppler (torsion R/o Or Mass Arterial Flow)  Result Date: 08/18/2017 CLINICAL DATA:  Left lower quadrant pain EXAM: TRANSABDOMINAL AND TRANSVAGINAL ULTRASOUND OF PELVIS DOPPLER ULTRASOUND OF OVARIES TECHNIQUE: Both transabdominal and transvaginal ultrasound examinations of the pelvis were performed. Transabdominal technique was performed for global imaging of the pelvis including uterus, ovaries, adnexal regions, and pelvic cul-de-sac. It was necessary to proceed with endovaginal exam following the transabdominal exam to visualize the ovaries and adnexa. Color and duplex Doppler ultrasound was utilized to evaluate blood flow to the ovaries. COMPARISON:  CT 08/18/2017 FINDINGS: Uterus Status post hysterectomy Endometrium Status post hysterectomy Right ovary Measurements: 2.7 x 1.8 x 1.6 cm. Normal appearance/no adnexal mass. Left ovary Measurements: 3.2 x 3.1 x 2.8 cm. Hemorrhagic cyst within the left ovary measuring 2.5 x 2 x 2.5 cm. Pulsed Doppler evaluation of both ovaries demonstrates normal low-resistance arterial and venous waveforms. Other findings No abnormal free fluid. IMPRESSION: 1. Status post hysterectomy. 2. Negative for ovarian torsion 3. 2.5 cm complex cyst in the left ovary with imaging features of a hemorrhagic cyst. Electronically Signed   By: Donavan Foil M.D.   On: 08/18/2017 20:06   Ct Renal Stone Study  Result Date: 08/18/2017 CLINICAL DATA:  Acute left flank pain. EXAM: CT ABDOMEN AND PELVIS WITHOUT CONTRAST TECHNIQUE: Multidetector CT imaging of the abdomen and pelvis was performed following the standard protocol without IV contrast. COMPARISON:  CT scan of March 15, 2016. FINDINGS: Lower chest: No acute abnormality. Hepatobiliary: No focal liver abnormality is seen. Status post cholecystectomy. No biliary dilatation. Pancreas: Unremarkable. No pancreatic  ductal dilatation or surrounding inflammatory changes. Spleen: Normal in size without focal abnormality. Adrenals/Urinary Tract: Adrenal glands are unremarkable. Kidneys are normal, without renal calculi, focal lesion, or hydronephrosis. Bladder is unremarkable. Stomach/Bowel: Stomach is within normal limits. Appendix appears normal. No evidence of bowel wall thickening, distention, or inflammatory changes. Vascular/Lymphatic: No significant vascular findings are present. No enlarged abdominal or pelvic lymph nodes. Reproductive: Status post hysterectomy. No adnexal masses. Other: No abdominal wall hernia or abnormality. No abdominopelvic ascites. Musculoskeletal: No acute or significant osseous findings. IMPRESSION: No hydronephrosis or renal obstruction is noted. No renal or ureteral calculi are noted. No significant abnormality seen in the abdomen or pelvis. Electronically Signed   By: Marijo Conception, M.D.   On: 08/18/2017 18:39      ____________________________________________   PROCEDURES  Procedure(s) performed: None Procedures Critical Care performed:  None ____________________________________________   INITIAL IMPRESSION / ASSESSMENT AND PLAN / ED COURSE   47 y.o. female history of hypothyroidism, hypertension, anxiety, kidney stones who presents for evaluation of left flank pain.  Patient is well-appearing, no distress, normal vital signs, she has mild left lower quadrant tenderness on palpation, no left  flank tenderness.  Also complaining of dark foul-smelling urine.  UA here is negative for urinary tract infection.  CBC and CMP are within normal limits.  Differential diagnoses including but not limited to kidney stone versus ovarian pathology versus diverticulitis versus colitis. Will get CT renal.    _________________________ 8:42 PM on 08/18/2017 -----------------------------------------  CT showing no acute findings.  Patient was sent for a transvaginal ultrasound which showed a  left hemorrhagic ovarian cyst with no evidence of torsion.  Patient's pain resolved after IV Toradol.  Patient is good to be discharged home on supportive care and follow-up with OB/GYN in a couple of weeks for repeat ultrasound.  Discussed signs and symptoms of ovarian torsion with the patient and recommended return to the emergency room if these develop.   As part of my medical decision making, I reviewed the following data within the [facility] notes reviewed and incorporated, Labs reviewed , Old chart reviewed, Radiograph reviewed , Notes from prior ED visits and [place] Controlled Substance Database    Pertinent labs & imaging results that were available during my care of the patient were reviewed by me and considered in my medical decision making (see chart for details).    ____________________________________________   FINAL CLINICAL IMPRESSION(S) / ED DIAGNOSES  Final diagnoses:  LLQ abdominal pain  Hemorrhagic ovarian cyst      NEW MEDICATIONS STARTED DURING THIS VISIT:  ED Discharge Orders    None       Note:  This document was prepared using Dragon voice recognition software and may include unintentional dictation errors.    Alfred Levins, [place], MD 08/18/17 2043

## 2017-08-18 NOTE — ED Triage Notes (Addendum)
Patient states having L sided flank pain since this AM that has progressively gotten worse. Patient is ambulating very slowly with grimace on face in triage. Reports she thinks this is a kidney stone. C/o cloudy, foul smelling urine. Patient reports pain feels like labor pains

## 2017-08-18 NOTE — ED Notes (Signed)
Patient transported to Ultrasound 

## 2017-08-21 LAB — URINE CULTURE

## 2019-08-20 IMAGING — CT CT RENAL STONE PROTOCOL
2 of 4 series · 16 of 46 positions shown, 18 images · non-contrast
Comparison: CT scan of March 15, 2016.

CLINICAL DATA: Acute left flank pain.

EXAM:
CT ABDOMEN AND PELVIS WITHOUT CONTRAST
TECHNIQUE: Multidetector CT imaging of the abdomen and pelvis was performed
following the standard protocol without IV contrast.

[Series 2: stone full standard · axial · 0.61mm/px · z∈[-484,-58]mm · 13 of 93 slices shown, 15 images]
[im 4/93  soft-tissue]
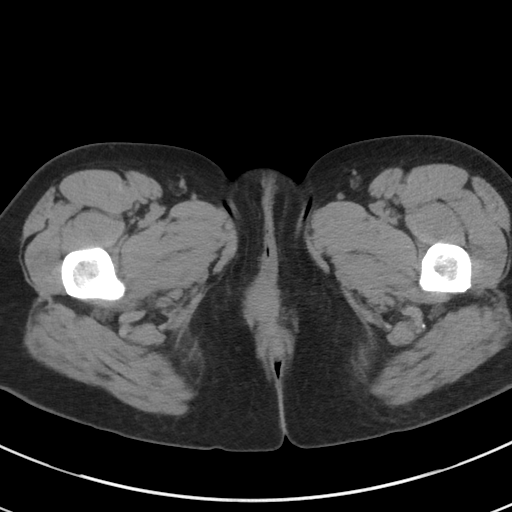
[im 4/93  bone]
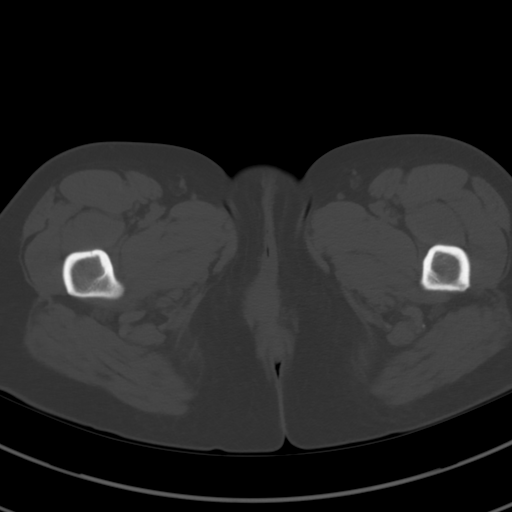
[im 12/93  soft-tissue]
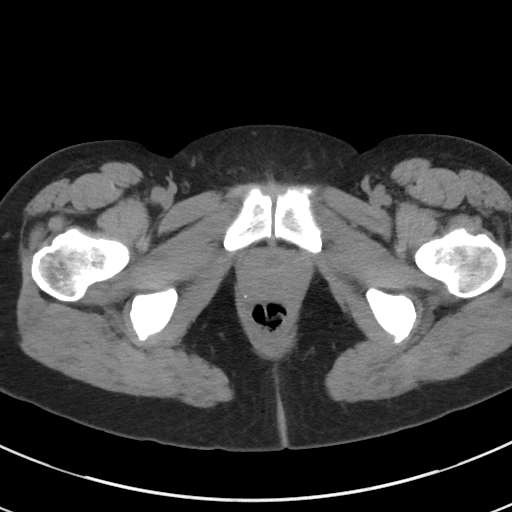
[im 20/93  soft-tissue]
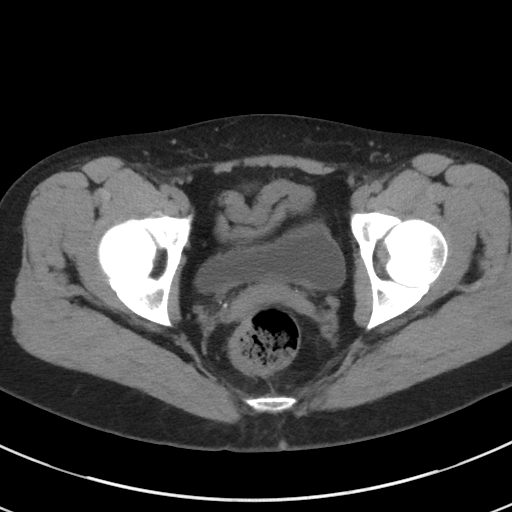
[im 27/93  soft-tissue]
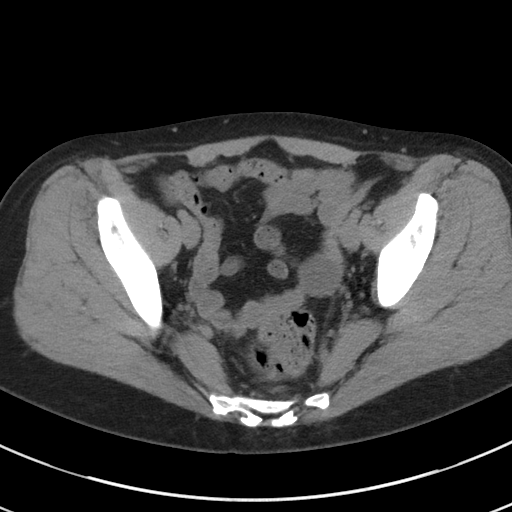
[im 31/93  soft-tissue]
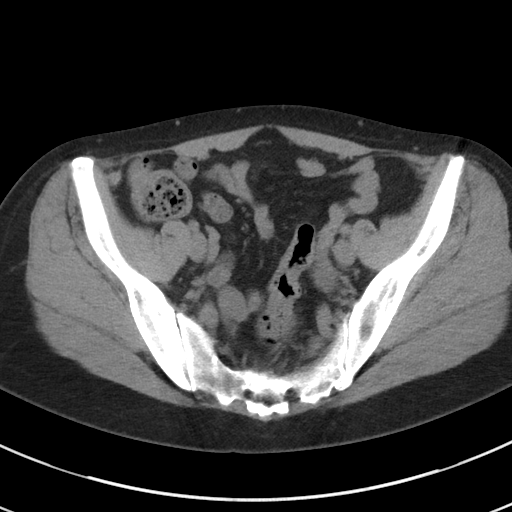
[im 39/93  soft-tissue]
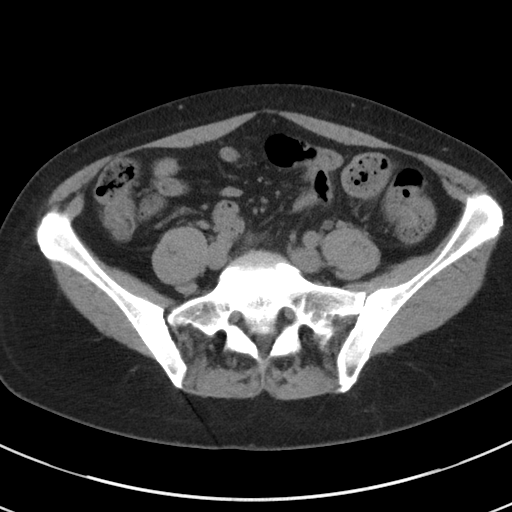
[im 47/93  soft-tissue]
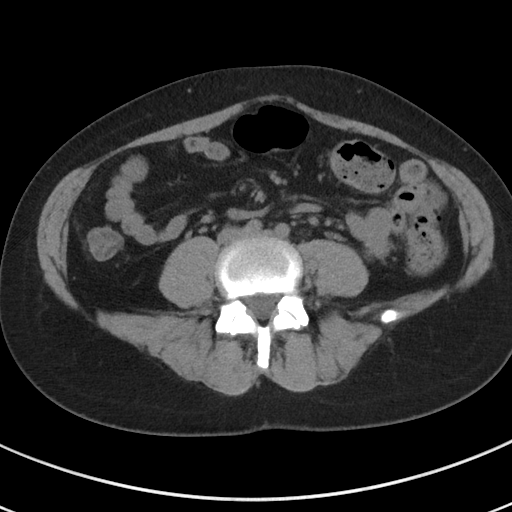
[im 54/93  soft-tissue]
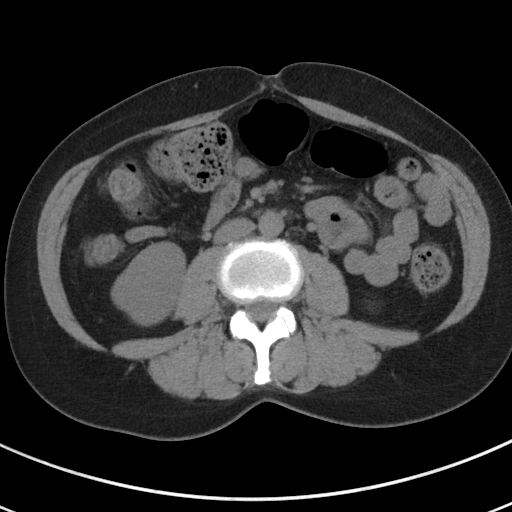
[im 62/93  soft-tissue]
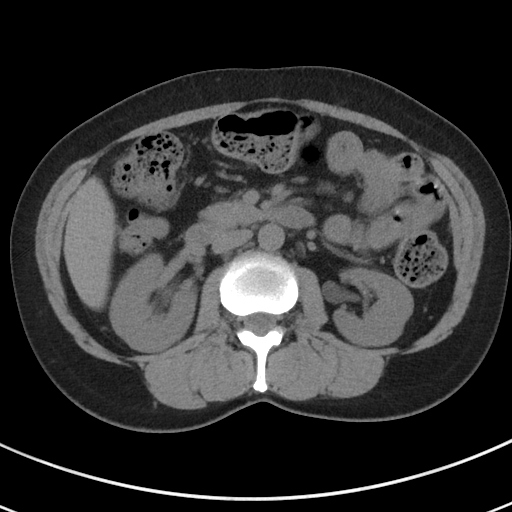
[im 62/93  bone]
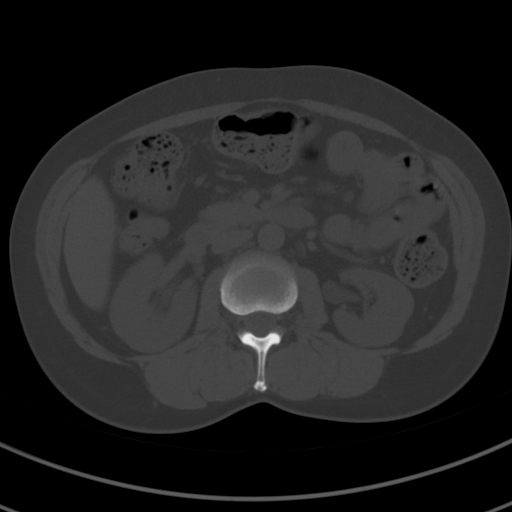
[im 66/93  soft-tissue]
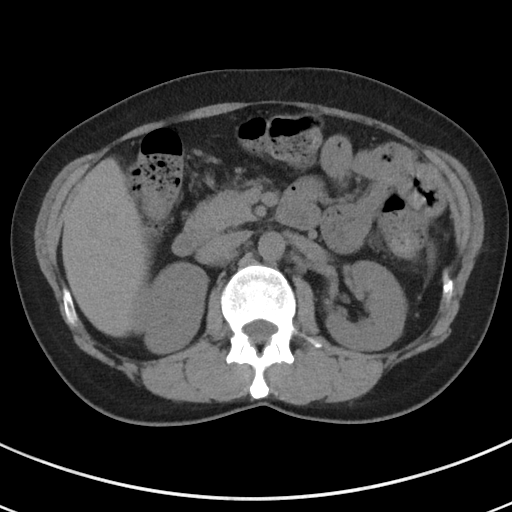
[im 73/93  soft-tissue]
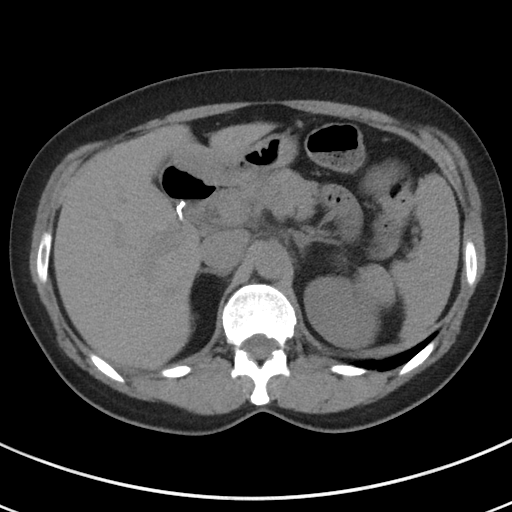
[im 81/93  soft-tissue]
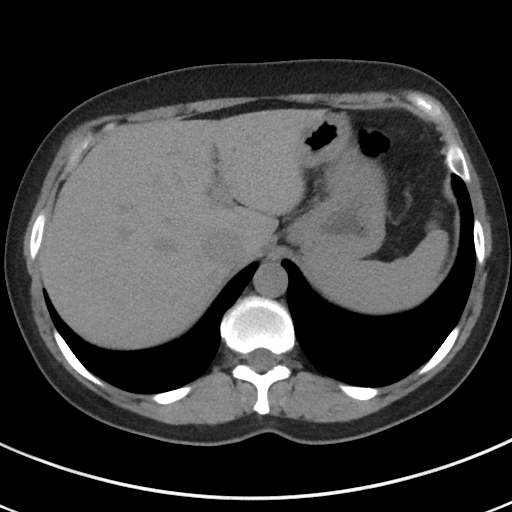
[im 89/93  soft-tissue]
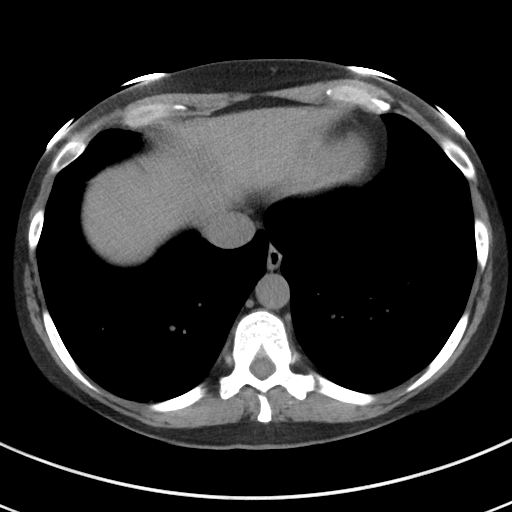

[Series 5: coronal · coronal · 0.71mm/px · 3 of 112 slices shown]
[im 38/112  soft-tissue]
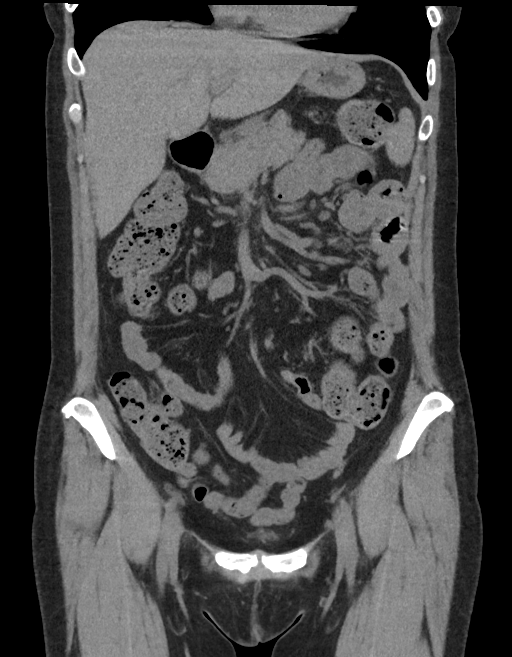
[im 50/112  soft-tissue]
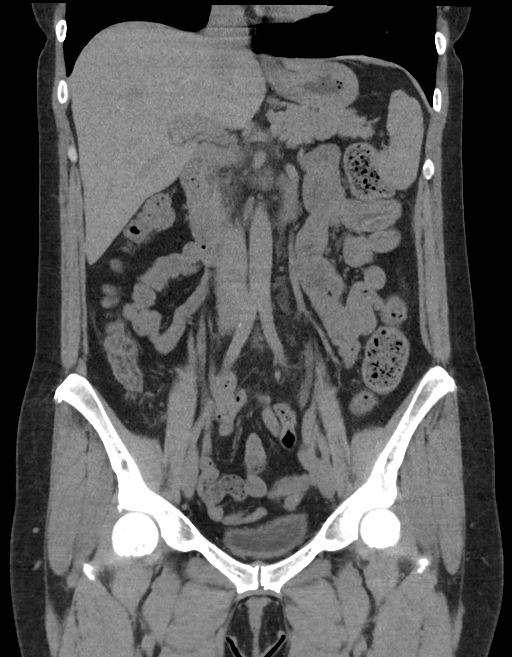
[im 62/112  soft-tissue]
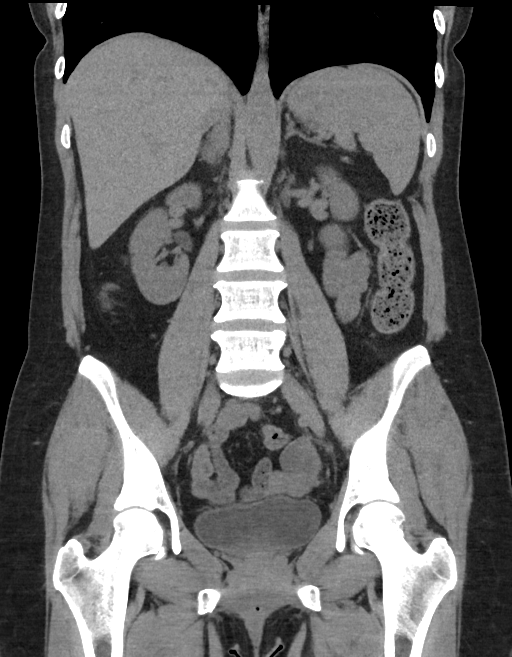

[16 of 46 positions shown; findings below may reference images not displayed]

FINDINGS: Lower chest: No acute abnormality.

Hepatobiliary: No focal liver abnormality is seen. Status post
cholecystectomy. No biliary dilatation.

Pancreas: Unremarkable. No pancreatic ductal dilatation or
surrounding inflammatory changes.

Spleen: Normal in size without focal abnormality.

Adrenals/Urinary Tract: Adrenal glands are unremarkable. Kidneys are
normal, without renal calculi, focal lesion, or hydronephrosis.
Bladder is unremarkable.

Stomach/Bowel: Stomach is within normal limits. Appendix appears
normal. No evidence of bowel wall thickening, distention, or
inflammatory changes.

Vascular/Lymphatic: No significant vascular findings are present. No
enlarged abdominal or pelvic lymph nodes.

Reproductive: Status post hysterectomy. No adnexal masses.

Other: No abdominal wall hernia or abnormality. No abdominopelvic
ascites.

Musculoskeletal: No acute or significant osseous findings.
IMPRESSION: No hydronephrosis or renal obstruction is noted. No renal or
ureteral calculi are noted.

No significant abnormality seen in the abdomen or pelvis.

## 2020-03-17 ENCOUNTER — Other Ambulatory Visit
Admission: RE | Admit: 2020-03-17 | Discharge: 2020-03-17 | Disposition: A | Discharge status: Home / Self Care | Payer: Managed Care, Other (non HMO) | Source: Ambulatory Visit | Attending: Student | Admitting: Student

## 2020-03-17 DIAGNOSIS — R079 Chest pain, unspecified: Secondary | ICD-10-CM | POA: Diagnosis not present

## 2020-03-17 LAB — D-DIMER, QUANTITATIVE: D-Dimer, Quant: 0.32 ug/mL-FEU (ref 0.00–0.50)

## 2020-08-02 ENCOUNTER — Other Ambulatory Visit: Payer: Self-pay | Admitting: Family Medicine

## 2020-08-02 DIAGNOSIS — Z1231 Encounter for screening mammogram for malignant neoplasm of breast: Secondary | ICD-10-CM

## 2020-08-18 ENCOUNTER — Ambulatory Visit
Admission: RE | Admit: 2020-08-18 | Discharge: 2020-08-18 | Disposition: A | Discharge status: Home / Self Care | Payer: Managed Care, Other (non HMO) | Source: Ambulatory Visit | Attending: Family Medicine | Admitting: Family Medicine

## 2020-08-18 ENCOUNTER — Other Ambulatory Visit: Payer: Self-pay

## 2020-08-18 DIAGNOSIS — Z1231 Encounter for screening mammogram for malignant neoplasm of breast: Secondary | ICD-10-CM | POA: Insufficient documentation

## 2020-09-11 ENCOUNTER — Other Ambulatory Visit
Admission: RE | Admit: 2020-09-11 | Discharge: 2020-09-11 | Disposition: A | Discharge status: Home / Self Care | Payer: Managed Care, Other (non HMO) | Source: Ambulatory Visit | Attending: Student | Admitting: Student

## 2020-09-11 DIAGNOSIS — R079 Chest pain, unspecified: Secondary | ICD-10-CM | POA: Insufficient documentation

## 2020-09-11 LAB — TROPONIN I (HIGH SENSITIVITY): Troponin I (High Sensitivity): 3 ng/L (ref <18)

## 2020-11-15 ENCOUNTER — Other Ambulatory Visit: Payer: Self-pay

## 2020-11-15 MED ORDER — SERTRALINE HCL 100 MG PO TABS
ORAL_TABLET | ORAL | 11 refills | Status: DC
  Filled 2020-11-15 – 2020-11-22 (×2): qty 30, 30d supply, fill #0
  Filled 2020-12-27: qty 30, 30d supply, fill #1
  Filled 2021-01-30: qty 30, 30d supply, fill #2
  Filled 2021-02-20: qty 30, 30d supply, fill #3
  Filled 2021-03-23: qty 30, 30d supply, fill #4
  Filled 2021-04-23: qty 30, 30d supply, fill #5
  Filled 2021-05-24: qty 30, 30d supply, fill #6
  Filled 2021-06-27: qty 30, 30d supply, fill #7
  Filled 2021-07-26: qty 30, 30d supply, fill #8
  Filled 2021-08-24: qty 30, 30d supply, fill #9

## 2020-11-15 MED ORDER — THYROID 120 MG PO TABS
ORAL_TABLET | ORAL | 12 refills | Status: DC
  Filled 2020-11-15: qty 30, 30d supply, fill #0
  Filled 2020-12-15: qty 30, 30d supply, fill #1

## 2020-11-17 ENCOUNTER — Other Ambulatory Visit: Payer: Self-pay

## 2020-11-22 ENCOUNTER — Other Ambulatory Visit: Payer: Self-pay

## 2020-11-29 ENCOUNTER — Other Ambulatory Visit: Payer: Self-pay

## 2020-12-04 ENCOUNTER — Other Ambulatory Visit: Payer: Self-pay

## 2020-12-04 MED ORDER — SUMATRIPTAN SUCCINATE 25 MG PO TABS: ORAL_TABLET | ORAL | 0 refills | Status: DC

## 2020-12-04 MED ORDER — HYDROCORTISONE (PERIANAL) 2.5 % EX CREA: TOPICAL_CREAM | CUTANEOUS | 0 refills | Status: DC

## 2020-12-04 MED ORDER — TRIAMCINOLONE ACETONIDE 0.1 % EX CREA: TOPICAL_CREAM | CUTANEOUS | 0 refills | Status: DC

## 2020-12-04 MED ORDER — METOPROLOL SUCCINATE ER 25 MG PO TB24: ORAL_TABLET | ORAL | 10 refills | Status: DC

## 2020-12-04 MED ORDER — NITROGLYCERIN 0.4 MG SL SUBL: SUBLINGUAL_TABLET | SUBLINGUAL | 11 refills | Status: DC

## 2020-12-04 MED ORDER — FUROSEMIDE 20 MG PO TABS: ORAL_TABLET | ORAL | 0 refills | Status: DC

## 2020-12-04 MED ORDER — LEVOTHYROXINE SODIUM 112 MCG PO TABS
ORAL_TABLET | ORAL | 3 refills | Status: DC
  Filled 2020-12-04 – 2021-03-29 (×2): qty 96, 90d supply, fill #0

## 2020-12-15 ENCOUNTER — Other Ambulatory Visit: Payer: Self-pay

## 2020-12-27 ENCOUNTER — Other Ambulatory Visit: Payer: Self-pay

## 2021-01-12 ENCOUNTER — Telehealth: Payer: Self-pay

## 2021-01-12 NOTE — Telephone Encounter (Signed)
Spoke with patient and advised her that Dr. Laurence Ferrari will not be in the office the week of 01/16/21. She was at work and unable to reschedule at that time and asked that we call her back on Monday (since our office is closed Friday).

## 2021-01-16 ENCOUNTER — Ambulatory Visit: Payer: Managed Care, Other (non HMO) | Admitting: Dermatology

## 2021-01-18 ENCOUNTER — Ambulatory Visit: Payer: Managed Care, Other (non HMO) | Admitting: Dermatology

## 2021-01-30 ENCOUNTER — Other Ambulatory Visit: Payer: Self-pay

## 2021-02-06 ENCOUNTER — Other Ambulatory Visit: Payer: Self-pay

## 2021-02-20 ENCOUNTER — Other Ambulatory Visit: Payer: Self-pay

## 2021-02-20 ENCOUNTER — Ambulatory Visit (INDEPENDENT_AMBULATORY_CARE_PROVIDER_SITE_OTHER): Payer: Managed Care, Other (non HMO) | Admitting: Dermatology

## 2021-02-20 DIAGNOSIS — L905 Scar conditions and fibrosis of skin: Secondary | ICD-10-CM

## 2021-02-20 DIAGNOSIS — D1800 Hemangioma unspecified site: Secondary | ICD-10-CM

## 2021-02-20 DIAGNOSIS — D229 Melanocytic nevi, unspecified: Secondary | ICD-10-CM

## 2021-02-20 DIAGNOSIS — L578 Other skin changes due to chronic exposure to nonionizing radiation: Secondary | ICD-10-CM

## 2021-02-20 DIAGNOSIS — L820 Inflamed seborrheic keratosis: Secondary | ICD-10-CM | POA: Diagnosis not present

## 2021-02-20 DIAGNOSIS — Z1283 Encounter for screening for malignant neoplasm of skin: Secondary | ICD-10-CM

## 2021-02-20 DIAGNOSIS — L739 Follicular disorder, unspecified: Secondary | ICD-10-CM | POA: Diagnosis not present

## 2021-02-20 DIAGNOSIS — L814 Other melanin hyperpigmentation: Secondary | ICD-10-CM

## 2021-02-20 DIAGNOSIS — L409 Psoriasis, unspecified: Secondary | ICD-10-CM | POA: Diagnosis not present

## 2021-02-20 DIAGNOSIS — D492 Neoplasm of unspecified behavior of bone, soft tissue, and skin: Secondary | ICD-10-CM | POA: Diagnosis not present

## 2021-02-20 DIAGNOSIS — L700 Acne vulgaris: Secondary | ICD-10-CM

## 2021-02-20 MED ORDER — CLINDAMYCIN PHOSPHATE 1 % EX SOLN
CUTANEOUS | 0 refills | Status: DC
Start: 2021-02-20 — End: 2023-10-16

## 2021-02-20 MED ORDER — VTAMA 1 % EX CREA: 1.0000 "application " | TOPICAL_CREAM | Freq: Every day | CUTANEOUS | 1 refills | Status: DC

## 2021-02-20 MED ORDER — VTAMA 1 % EX CREA
1.0000 | TOPICAL_CREAM | Freq: Every day | CUTANEOUS | 1 refills | Status: DC
Start: 2021-02-20 — End: 2023-10-24

## 2021-02-20 NOTE — Progress Notes (Signed)
New Patient Visit  Subjective  Colleen Phillips is a 51 y.o. female who presents for the following: Annual Exam (Mole check ). The patient presents for Total-Body Skin Exam (TBSE) for skin cancer screening and mole check.  The patient has spots, moles and lesions to be evaluated, some may be new or changing and the patient has concerns that these could be cancer.  +joint pain- knees, shoulders, elbows  The following portions of the chart were reviewed this encounter and updated as appropriate:   Tobacco   Allergies   Meds   Problems   Med Hx   Surg Hx   Fam Hx       Review of Systems:  No other skin or systemic complaints except as noted in HPI or Assessment and Plan.  Objective  Well appearing patient in no apparent distress; mood and affect are within normal limits.  A full examination was performed including scalp, head, eyes, ears, nose, lips, neck, chest, axillae, abdomen, back, buttocks, bilateral upper extremities, bilateral lower extremities, hands, feet, fingers, toes, fingernails, and toenails. All findings within normal limits unless otherwise noted below.  chest folliculitis   Left Thigh - Anterior 0.4 cm pink brown firm papule with dimple sign, normal appearing overlaying network   groin, axilla, postauricular Scaly pink papules coalescing to plaques   right labia majora Dyspigmented smooth macule or patch.   Head - Anterior (Face) Erythematous papules and pustules with comedones   right upper eyelid above medial canthus, left alar crease  (2) (2) Stuck-on, waxy, tan-brown papules     Assessment & Plan  Folliculitis chest  Chronic and persistent   Recommend [facility] Hypochlorous Spray (found in the wound care section) OR Cln brand Acne or Sports wash. The [facility] Hypochlorous Spray can be sprayed on daily and left on. The Cln wash should be applied to the affected area daily for at least 30 seconds and then rinsed off.   Neoplasm of skin Left Thigh -  Anterior  Favor Dermatofibroma with overlying lentigo. Discussed option of biopsy vs monitor, will monitor at this time  Benign-appearing.  Observation.  Call clinic for new or changing moles.  Recommend daily use of broad spectrum spf 30+ sunscreen to sun-exposed areas.   Psoriasis groin, axilla, postauricular  Favor Psoriasis   Psoriasis is a chronic non-curable, but treatable genetic/hereditary disease that may have other systemic features affecting other organ systems such as joints (Psoriatic Arthritis). It is associated with an increased risk of inflammatory bowel disease, heart disease, non-alcoholic fatty liver disease, and depression.     Start Vtama cream apply to skin once a day   We will refer to rheumatologist for evaluation due to joint pain  Related Medications Tapinarof (VTAMA) 1 % CREA Apply 1 application topically daily.  Scar right labia majora  The patient will observe these symptoms, and report promptly any worsening or unexpected persistence.  If well, may return prn.   Acne vulgaris Head - Anterior (Face)  Chronic condition with duration or expected duration over one year. Condition is bothersome to patient. Currently flared.  Start Clindamycin solution apply to acne on face twice a day   Start clindamycin solution in the am to affected areas. Pair with Cln acne wash, PanOyxl 4% creamy wash (benzoyl peroxide wash) or [facility] hypochlorous spray to prevent antibiotic resistance.   Related Medications clindamycin (CLEOCIN T) 1 % external solution Apply to face twice a day for acne  Inflamed seborrheic keratosis (2) right upper eyelid  above medial canthus, left alar crease  (2)  ISK vs lichen simplex chronicus overlying a cyst  Symptomatic  Prior to procedure, discussed risks of blister formation, small wound, skin dyspigmentation, or rare scar following cryotherapy. Recommend Vaseline ointment to treated areas while healing.   Destruction of  lesion - right upper eyelid above medial canthus, left alar crease  (2)  Destruction method: cryotherapy   Informed consent: discussed and consent obtained   Lesion destroyed using liquid nitrogen: Yes   Cryotherapy cycles:  2 Outcome: patient tolerated procedure well with no complications   Post-procedure details: wound care instructions given     Lentigines - Scattered tan macules - Due to sun exposure - Benign-appearing, observe - Recommend daily broad spectrum sunscreen SPF 30+ to sun-exposed areas, reapply every 2 hours as needed. - Call for any changes  Seborrheic Keratoses Legs,face - Stuck-on, waxy, tan-brown papules and/or plaques  - Benign-appearing - Discussed benign etiology and prognosis. - Observe - Call for any changes  Melanocytic Nevi - Tan-brown and/or pink-flesh-colored symmetric macules and papules - Benign appearing on exam today - Observation - Call clinic for new or changing moles - Recommend daily use of broad spectrum spf 30+ sunscreen to sun-exposed areas.   Hemangiomas - Red papules - Discussed benign nature - Observe - Call for any changes  Actinic Damage Chest  - chronic, secondary to cumulative UV radiation exposure/sun exposure over time - diffuse scaly erythematous macules with underlying dyspigmentation - Recommend daily broad spectrum sunscreen SPF 30+ to sun-exposed areas, reapply every 2 hours as needed.  - Recommend staying in the shade or wearing long sleeves, sun glasses (UVA+UVB protection) and wide brim hats (4-inch brim around the entire circumference of the hat). - Call for new or changing lesions.    Skin cancer screening performed today.   Return in about 3 months (around 05/21/2021) for acne, psorasis .   I, Marye Round, CMA, am acting as scribe for Forest Gleason, MD .   Documentation: I have reviewed the above documentation for accuracy and completeness, and I agree with the above.  Forest Gleason, MD

## 2021-02-20 NOTE — Patient Instructions (Addendum)
Prior to procedure, discussed risks of blister formation, small wound, skin dyspigmentation, or rare scar following cryotherapy. Recommend Vaseline ointment to treated areas while healing.   Start clindamycin solution in the am to affected areas. Pair with Cln acne wash, PanOyxl 4% creamy wash (benzoyl peroxide wash) or [facility] hypochlorous spray to prevent antibiotic resistance.  Recommend [facility] Hypochlorous Spray (found in the wound care section) OR Cln brand Acne or Sports wash. The [facility] Hypochlorous Spray can be sprayed on daily and left on. The Cln wash should be applied to the affected area daily for at least 30 seconds and then rinsed off. If you are using clindamycin solution or lotion or another topical antibiotic to treat acne, using a hypochlorous product may help lower the risk of antibiotic resistant bacteria.      If You Need Anything After Your Visit  If you have any questions or concerns for your doctor, please call our main line at [phone] and press option 4 to reach your doctor's medical assistant. If no one answers, please leave a voicemail as directed and we will return your call as soon as possible. Messages left after 4 pm will be answered the following business day.   You may also send [place] a message via [place]. We typically respond to MyChart messages within 1-2 business days.  For prescription refills, please ask your pharmacy to contact our office. Our fax number is [phone].  If you have an urgent issue when the clinic is closed that cannot wait until the next business day, you can page your doctor at the number below.    Please note that while we do our best to be available for urgent issues outside of office hours, we are not available 24/7.   If you have an urgent issue and are unable to reach [place], you may choose to seek medical care at your doctor's office, retail clinic, urgent care center, or emergency room.  If you have a medical emergency,  please immediately call 911 or go to the emergency department.  Pager Numbers  - Dr. Nehemiah Massed: [phone]  - Dr. Laurence Ferrari: [phone]  - Dr. Dava Kindred: [phone]  In the event of inclement weather, please call our main line at [phone] for an update on the status of any delays or closures.  Dermatology Medication Tips: Please keep the boxes that topical medications come in in order to help keep track of the instructions about where and how to use these. Pharmacies typically print the medication instructions only on the boxes and not directly on the medication tubes.   If your medication is too expensive, please contact our office at [phone] option 4 or send [place] a message through [place].   We are unable to tell what your co-pay for medications will be in advance as this is different depending on your insurance coverage. However, we may be able to find a substitute medication at lower cost or fill out paperwork to get insurance to cover a needed medication.   If a prior authorization is required to get your medication covered by your insurance company, please allow [place] 1-2 business days to complete this process.  Drug prices often vary depending on where the prescription is filled and some pharmacies may offer cheaper prices.  The website www.goodrx.com contains coupons for medications through different pharmacies. The prices here do not account for what the cost may be with help from insurance (it may be cheaper with your insurance), but the website can give you the price  if you did not use any insurance.  - You can print the associated coupon and take it with your prescription to the pharmacy.  - You may also stop by our office during regular business hours and pick up a GoodRx coupon card.  - If you need your prescription sent electronically to a different pharmacy, notify our office through [facility] or by phone at [phone] option 4.     Si Usted Necesita Algo  Despus de Su Visita  Tambin puede enviarnos un mensaje a travs de Pharmacist, community. Por lo general respondemos a los mensajes de MyChart en el transcurso de 1 a 2 das hbiles.  Para renovar recetas, por favor pida a su farmacia que se ponga en contacto con nuestra oficina. Harland Dingwall de fax es [place] [phone].  Si tiene un asunto urgente cuando la clnica est cerrada y que no puede esperar hasta el siguiente da hbil, puede llamar/localizar a su doctor(a) al nmero que aparece a continuacin.   Por favor, tenga en cuenta que aunque hacemos todo lo posible para estar disponibles para asuntos urgentes fuera del horario de [place], no estamos disponibles las 24 horas del da, los 7 das de la [place].   Si tiene un problema urgente y no puede comunicarse con nosotros, puede optar por buscar atencin mdica  en el consultorio de su doctor(a), en una clnica privada, en un centro de atencin urgente o en una sala de emergencias.  Si tiene Engineering geologist, por favor llame inmediatamente al 911 o vaya a la sala de emergencias.  Nmeros de bper  - Dr. Nehemiah Massed: [phone]  - Dra. Moye: [phone]  - Dra. Angelene Kindred: [phone]  En caso de inclemencias del [place], por favor llame a Johnsie Kindred principal al [phone] para una actualizacin sobre el [place] de cualquier retraso o cierre.  Consejos para la medicacin en dermatologa: Por favor, guarde las cajas en las que vienen los medicamentos de uso tpico para ayudarle a seguir las instrucciones sobre dnde y cmo usarlos. Las farmacias generalmente imprimen las instrucciones del medicamento slo en las cajas y no directamente en los tubos del [place].   Si su medicamento es muy caro, por favor, pngase en contacto con Zigmund Daniel llamando al [phone] y presione la opcin 4 o envenos un mensaje a travs de Pharmacist, community.   No podemos decirle cul ser su copago por los medicamentos por adelantado ya que esto es diferente  dependiendo de la cobertura de su seguro. Sin embargo, es posible que podamos encontrar un medicamento sustituto a Electrical engineer un formulario para que el seguro cubra el medicamento que se considera necesario.   Si se requiere una autorizacin previa para que su compaa de seguros [place] su medicamento, por favor permtanos de 1 a 2 das hbiles para completar este proceso.  Los precios de los medicamentos varan con frecuencia dependiendo del Environmental consultant de dnde se surte la receta y alguna farmacias pueden ofrecer precios ms baratos.  El sitio web www.goodrx.com tiene cupones para medicamentos de Airline pilot. Los precios aqu no tienen en cuenta lo que podra costar con la ayuda del seguro (puede ser ms barato con su seguro), pero el sitio web puede darle el precio si no utiliz Research scientist (physical sciences).  - Puede imprimir el cupn correspondiente y llevarlo con su receta a la farmacia.  - Tambin puede pasar por nuestra oficina durante el horario de atencin regular y Charity fundraiser una tarjeta de cupones de GoodRx.  - Si necesita que su  receta se enve electrnicamente a una farmacia diferente, informe a nuestra oficina a travs de MyChart de [place] o por telfono llamando al [phone] y presione la opcin 4.

## 2021-02-22 ENCOUNTER — Encounter: Payer: Self-pay | Admitting: Dermatology

## 2021-02-22 ENCOUNTER — Other Ambulatory Visit: Payer: Self-pay

## 2021-02-22 DIAGNOSIS — L4050 Arthropathic psoriasis, unspecified: Secondary | ICD-10-CM

## 2021-02-22 NOTE — Progress Notes (Signed)
Referral to Rheumatology [facility])

## 2021-03-14 ENCOUNTER — Other Ambulatory Visit: Payer: Self-pay

## 2021-03-15 ENCOUNTER — Other Ambulatory Visit: Payer: Self-pay

## 2021-03-15 MED ORDER — METHOTREXATE 2.5 MG PO TABS
ORAL_TABLET | ORAL | 2 refills | Status: DC
  Filled 2021-03-15: qty 24, 28d supply, fill #0

## 2021-03-15 MED ORDER — FOLIC ACID 1 MG PO TABS
ORAL_TABLET | ORAL | 3 refills | Status: DC
  Filled 2021-03-15: qty 90, 90d supply, fill #0

## 2021-03-16 ENCOUNTER — Other Ambulatory Visit: Payer: Self-pay

## 2021-03-16 MED ORDER — PHENTERMINE HCL 37.5 MG PO TABS
ORAL_TABLET | ORAL | 0 refills | Status: DC
  Filled 2021-03-16: qty 30, 30d supply, fill #0

## 2021-03-23 ENCOUNTER — Other Ambulatory Visit: Payer: Self-pay

## 2021-03-29 ENCOUNTER — Other Ambulatory Visit: Payer: Self-pay

## 2021-04-09 ENCOUNTER — Other Ambulatory Visit: Payer: Self-pay

## 2021-04-09 MED ORDER — PANTOPRAZOLE SODIUM 40 MG PO TBEC
DELAYED_RELEASE_TABLET | ORAL | 3 refills | Status: DC
  Filled 2021-04-09: qty 90, 90d supply, fill #0

## 2021-04-09 MED ORDER — AMOXICILLIN 875 MG PO TABS
ORAL_TABLET | ORAL | 0 refills | Status: DC
  Filled 2021-04-09: qty 14, 7d supply, fill #0

## 2021-04-11 ENCOUNTER — Other Ambulatory Visit: Payer: Self-pay | Admitting: Family Medicine

## 2021-04-11 DIAGNOSIS — K429 Umbilical hernia without obstruction or gangrene: Secondary | ICD-10-CM

## 2021-04-20 ENCOUNTER — Ambulatory Visit: Payer: Managed Care, Other (non HMO) | Attending: Family Medicine

## 2021-04-23 ENCOUNTER — Other Ambulatory Visit: Payer: Self-pay

## 2021-05-11 ENCOUNTER — Other Ambulatory Visit: Payer: Self-pay

## 2021-05-11 MED ORDER — VALACYCLOVIR HCL 500 MG PO TABS
ORAL_TABLET | ORAL | 0 refills | Status: DC
  Filled 2021-05-11: qty 6, 3d supply, fill #0

## 2021-05-24 ENCOUNTER — Other Ambulatory Visit: Payer: Self-pay

## 2021-05-24 ENCOUNTER — Ambulatory Visit: Payer: Managed Care, Other (non HMO) | Admitting: Dermatology

## 2021-05-24 MED ORDER — SUMATRIPTAN SUCCINATE 25 MG PO TABS
ORAL_TABLET | ORAL | 0 refills | Status: DC
  Filled 2021-05-24: qty 9, 15d supply, fill #0

## 2021-06-27 ENCOUNTER — Other Ambulatory Visit: Payer: Self-pay

## 2021-06-28 ENCOUNTER — Other Ambulatory Visit (HOSPITAL_COMMUNITY): Payer: Self-pay

## 2021-06-28 ENCOUNTER — Other Ambulatory Visit: Payer: Self-pay

## 2021-06-29 ENCOUNTER — Other Ambulatory Visit: Payer: Self-pay

## 2021-07-26 ENCOUNTER — Other Ambulatory Visit: Payer: Self-pay

## 2021-07-27 ENCOUNTER — Other Ambulatory Visit: Payer: Self-pay

## 2021-07-27 MED ORDER — SUMATRIPTAN SUCCINATE 25 MG PO TABS
ORAL_TABLET | ORAL | 2 refills | Status: DC
  Filled 2021-07-27 – 2021-08-17 (×2): qty 9, 15d supply, fill #0
  Filled 2021-10-22: qty 9, 15d supply, fill #1
  Filled 2021-11-20: qty 9, 15d supply, fill #2
  Filled 2022-02-08: qty 9, 15d supply, fill #3
  Filled 2022-04-09: qty 6, 10d supply, fill #4

## 2021-08-06 ENCOUNTER — Other Ambulatory Visit: Payer: Self-pay | Admitting: Family Medicine

## 2021-08-06 DIAGNOSIS — Z1231 Encounter for screening mammogram for malignant neoplasm of breast: Secondary | ICD-10-CM

## 2021-08-14 ENCOUNTER — Other Ambulatory Visit: Payer: Self-pay

## 2021-08-17 ENCOUNTER — Other Ambulatory Visit: Payer: Self-pay

## 2021-08-24 ENCOUNTER — Other Ambulatory Visit: Payer: Self-pay

## 2021-09-21 ENCOUNTER — Ambulatory Visit
Admission: RE | Admit: 2021-09-21 | Discharge: 2021-09-21 | Disposition: A | Discharge status: Home / Self Care | Payer: Managed Care, Other (non HMO) | Source: Ambulatory Visit | Attending: Family Medicine | Admitting: Family Medicine

## 2021-09-21 DIAGNOSIS — Z1231 Encounter for screening mammogram for malignant neoplasm of breast: Secondary | ICD-10-CM | POA: Diagnosis present

## 2021-09-25 ENCOUNTER — Other Ambulatory Visit: Payer: Self-pay

## 2021-09-25 MED ORDER — SERTRALINE HCL 100 MG PO TABS
100.0000 mg | ORAL_TABLET | Freq: Every day | ORAL | 11 refills | Status: DC
  Filled 2021-09-25: qty 30, 30d supply, fill #0
  Filled 2021-10-25: qty 30, 30d supply, fill #1
  Filled 2021-11-23: qty 30, 30d supply, fill #2
  Filled 2021-12-26: qty 30, 30d supply, fill #3
  Filled 2022-01-25: qty 30, 30d supply, fill #4
  Filled 2022-02-25: qty 30, 30d supply, fill #5
  Filled 2022-03-28: qty 30, 30d supply, fill #6
  Filled 2022-04-25: qty 30, 30d supply, fill #7
  Filled 2022-05-24: qty 30, 30d supply, fill #8
  Filled 2022-06-21: qty 30, 30d supply, fill #9
  Filled 2022-07-22: qty 30, 30d supply, fill #10

## 2021-09-27 ENCOUNTER — Other Ambulatory Visit: Payer: Self-pay | Admitting: Family Medicine

## 2021-09-27 DIAGNOSIS — N6489 Other specified disorders of breast: Secondary | ICD-10-CM

## 2021-09-27 DIAGNOSIS — R928 Other abnormal and inconclusive findings on diagnostic imaging of breast: Secondary | ICD-10-CM

## 2021-10-19 ENCOUNTER — Ambulatory Visit
Admission: RE | Admit: 2021-10-19 | Discharge: 2021-10-19 | Disposition: A | Discharge status: Home / Self Care | Payer: Managed Care, Other (non HMO) | Source: Ambulatory Visit | Attending: Family Medicine | Admitting: Family Medicine

## 2021-10-19 DIAGNOSIS — R928 Other abnormal and inconclusive findings on diagnostic imaging of breast: Secondary | ICD-10-CM | POA: Diagnosis present

## 2021-10-19 DIAGNOSIS — N6489 Other specified disorders of breast: Secondary | ICD-10-CM

## 2021-10-22 ENCOUNTER — Other Ambulatory Visit: Payer: Self-pay

## 2021-10-25 ENCOUNTER — Other Ambulatory Visit: Payer: Self-pay

## 2021-11-09 ENCOUNTER — Other Ambulatory Visit: Payer: Self-pay

## 2021-11-09 MED ORDER — METHOTREXATE SODIUM 2.5 MG PO TABS
ORAL_TABLET | ORAL | 2 refills | Status: DC
  Filled 2021-11-09: qty 24, 28d supply, fill #0

## 2021-11-09 MED ORDER — FOLIC ACID 1 MG PO TABS
ORAL_TABLET | ORAL | 3 refills | Status: DC
  Filled 2021-11-09: qty 90, 90d supply, fill #0

## 2021-11-20 ENCOUNTER — Other Ambulatory Visit: Payer: Self-pay

## 2021-11-23 ENCOUNTER — Other Ambulatory Visit: Payer: Self-pay

## 2021-12-07 ENCOUNTER — Other Ambulatory Visit: Payer: Self-pay | Admitting: Family Medicine

## 2021-12-07 DIAGNOSIS — R1011 Right upper quadrant pain: Secondary | ICD-10-CM

## 2021-12-07 DIAGNOSIS — R109 Unspecified abdominal pain: Secondary | ICD-10-CM

## 2021-12-12 ENCOUNTER — Other Ambulatory Visit: Payer: Self-pay

## 2021-12-26 ENCOUNTER — Other Ambulatory Visit: Payer: Self-pay

## 2022-01-25 ENCOUNTER — Other Ambulatory Visit: Payer: Self-pay

## 2022-02-08 ENCOUNTER — Other Ambulatory Visit: Payer: Self-pay

## 2022-02-12 ENCOUNTER — Other Ambulatory Visit: Payer: Self-pay

## 2022-02-14 ENCOUNTER — Other Ambulatory Visit: Payer: Self-pay

## 2022-02-14 MED ORDER — AMOXICILLIN 875 MG PO TABS
875.0000 mg | ORAL_TABLET | Freq: Two times a day (BID) | ORAL | 0 refills | Status: DC
  Filled 2022-02-14: qty 14, 7d supply, fill #0

## 2022-02-25 ENCOUNTER — Other Ambulatory Visit: Payer: Self-pay

## 2022-02-25 MED ORDER — PHENTERMINE HCL 37.5 MG PO TABS
37.5000 mg | ORAL_TABLET | ORAL | 0 refills | Status: DC
  Filled 2022-02-25: qty 30, 30d supply, fill #0

## 2022-03-13 ENCOUNTER — Other Ambulatory Visit: Payer: Self-pay

## 2022-03-13 MED ORDER — PHENDIMETRAZINE TARTRATE ER 105 MG PO CP24
105.0000 mg | ORAL_CAPSULE | Freq: Every day | ORAL | 0 refills | Status: DC
  Filled 2022-03-13 – 2022-03-14 (×2): qty 30, 30d supply, fill #0

## 2022-03-14 ENCOUNTER — Other Ambulatory Visit: Payer: Self-pay

## 2022-03-15 ENCOUNTER — Other Ambulatory Visit: Payer: Self-pay

## 2022-03-21 ENCOUNTER — Other Ambulatory Visit: Payer: Self-pay

## 2022-03-21 MED ORDER — PHENDIMETRAZINE TARTRATE 35 MG PO TABS
35.0000 mg | ORAL_TABLET | Freq: Three times a day (TID) | ORAL | 0 refills | Status: DC
  Filled 2022-03-21: qty 90, 30d supply, fill #0

## 2022-03-22 ENCOUNTER — Other Ambulatory Visit: Payer: Self-pay

## 2022-03-25 ENCOUNTER — Ambulatory Visit: Payer: Managed Care, Other (non HMO)

## 2022-03-25 DIAGNOSIS — K642 Third degree hemorrhoids: Secondary | ICD-10-CM

## 2022-03-25 DIAGNOSIS — Z1211 Encounter for screening for malignant neoplasm of colon: Secondary | ICD-10-CM | POA: Diagnosis present

## 2022-03-28 ENCOUNTER — Other Ambulatory Visit: Payer: Self-pay

## 2022-04-09 ENCOUNTER — Other Ambulatory Visit: Payer: Self-pay

## 2022-04-09 MED ORDER — SUMATRIPTAN SUCCINATE 25 MG PO TABS
25.0000 mg | ORAL_TABLET | Freq: Every day | ORAL | 3 refills | Status: DC | PRN
  Filled 2022-04-09: qty 9, 15d supply, fill #0
  Filled 2022-05-17: qty 9, 24d supply, fill #0
  Filled 2022-06-28: qty 9, 24d supply, fill #1
  Filled 2022-07-22: qty 9, 24d supply, fill #2
  Filled 2022-08-12 (×2): qty 9, 24d supply, fill #3
  Filled 2022-09-13: qty 9, 24d supply, fill #4
  Filled 2022-10-18: qty 3, 3d supply, fill #5

## 2022-04-11 ENCOUNTER — Other Ambulatory Visit: Payer: Self-pay

## 2022-04-11 MED ORDER — METOPROLOL SUCCINATE ER 25 MG PO TB24
12.5000 mg | ORAL_TABLET | Freq: Every day | ORAL | 3 refills | Status: DC | PRN
  Filled 2022-04-11: qty 15, 30d supply, fill #0

## 2022-04-11 MED ORDER — FUROSEMIDE 20 MG PO TABS
20.0000 mg | ORAL_TABLET | Freq: Every day | ORAL | 3 refills | Status: DC | PRN
  Filled 2022-04-11 – 2022-04-25 (×2): qty 30, 30d supply, fill #0
  Filled 2022-05-23: qty 30, 30d supply, fill #1
  Filled 2022-10-16: qty 30, 30d supply, fill #2
  Filled 2023-01-24: qty 30, 30d supply, fill #3

## 2022-04-23 ENCOUNTER — Other Ambulatory Visit: Payer: Self-pay

## 2022-04-25 ENCOUNTER — Other Ambulatory Visit: Payer: Self-pay

## 2022-05-01 ENCOUNTER — Other Ambulatory Visit: Payer: Self-pay

## 2022-05-01 MED ORDER — OMEPRAZOLE 20 MG PO CPDR
20.0000 mg | DELAYED_RELEASE_CAPSULE | Freq: Every day | ORAL | 1 refills | Status: DC
  Filled 2022-05-01: qty 30, 30d supply, fill #0

## 2022-05-07 ENCOUNTER — Other Ambulatory Visit: Payer: Self-pay

## 2022-05-09 ENCOUNTER — Other Ambulatory Visit: Payer: Self-pay

## 2022-05-09 MED ORDER — SERTRALINE HCL 50 MG PO TABS
150.0000 mg | ORAL_TABLET | Freq: Every day | ORAL | 11 refills | Status: AC
  Filled 2022-05-09 – 2022-08-12 (×2): qty 90, 30d supply, fill #0
  Filled 2022-09-06: qty 90, 30d supply, fill #1
  Filled 2022-10-03: qty 90, 30d supply, fill #2
  Filled 2022-11-12: qty 90, 30d supply, fill #3
  Filled 2022-12-10 (×2): qty 90, 30d supply, fill #4

## 2022-05-09 MED ORDER — ESTRADIOL 0.1 MG/GM VA CREA
0.5000 g | TOPICAL_CREAM | VAGINAL | 3 refills | Status: DC
  Filled 2022-05-09: qty 42.5, 30d supply, fill #0

## 2022-05-17 ENCOUNTER — Other Ambulatory Visit: Payer: Self-pay

## 2022-05-23 ENCOUNTER — Other Ambulatory Visit: Payer: Self-pay

## 2022-05-24 ENCOUNTER — Other Ambulatory Visit: Payer: Self-pay

## 2022-06-17 ENCOUNTER — Other Ambulatory Visit: Payer: Self-pay

## 2022-06-21 ENCOUNTER — Other Ambulatory Visit: Payer: Self-pay

## 2022-06-25 ENCOUNTER — Other Ambulatory Visit: Payer: Self-pay

## 2022-06-28 ENCOUNTER — Other Ambulatory Visit: Payer: Self-pay

## 2022-07-02 ENCOUNTER — Other Ambulatory Visit: Payer: Self-pay

## 2022-07-02 MED ORDER — HYDROCORTISONE 1 % EX CREA
TOPICAL_CREAM | Freq: Two times a day (BID) | CUTANEOUS | 0 refills | Status: DC
  Filled 2022-07-02: qty 28, 30d supply, fill #0

## 2022-07-02 MED ORDER — LIDOCAINE-PRILOCAINE 2.5-2.5 % EX CREA
1.0000 | TOPICAL_CREAM | CUTANEOUS | 0 refills | Status: DC | PRN
  Filled 2022-07-02: qty 30, 10d supply, fill #0

## 2022-07-05 ENCOUNTER — Other Ambulatory Visit: Payer: Self-pay

## 2022-07-22 ENCOUNTER — Other Ambulatory Visit: Payer: Self-pay

## 2022-08-12 ENCOUNTER — Other Ambulatory Visit: Payer: Self-pay

## 2022-08-22 ENCOUNTER — Other Ambulatory Visit: Payer: Self-pay

## 2022-08-22 MED ORDER — AMOXICILLIN-POT CLAVULANATE 500-125 MG PO TABS
1.0000 | ORAL_TABLET | Freq: Three times a day (TID) | ORAL | 0 refills | Status: DC
  Filled 2022-08-22: qty 21, 7d supply, fill #0

## 2022-09-06 ENCOUNTER — Other Ambulatory Visit: Payer: Self-pay

## 2022-09-13 ENCOUNTER — Other Ambulatory Visit: Payer: Self-pay

## 2022-09-19 ENCOUNTER — Other Ambulatory Visit: Payer: Self-pay

## 2022-09-19 MED ORDER — CIPROFLOXACIN HCL 250 MG PO TABS
250.0000 mg | ORAL_TABLET | Freq: Two times a day (BID) | ORAL | 0 refills | Status: AC
  Filled 2022-09-19: qty 14, 7d supply, fill #0

## 2022-10-03 ENCOUNTER — Other Ambulatory Visit: Payer: Self-pay

## 2022-10-18 ENCOUNTER — Other Ambulatory Visit: Payer: Self-pay

## 2022-10-18 MED ORDER — SUMATRIPTAN SUCCINATE 25 MG PO TABS
25.0000 mg | ORAL_TABLET | Freq: Every day | ORAL | 3 refills | Status: DC | PRN
  Filled 2022-10-18: qty 9, 15d supply, fill #0
  Filled 2022-12-02: qty 9, 15d supply, fill #1
  Filled 2023-09-25: qty 9, 15d supply, fill #2

## 2022-10-21 ENCOUNTER — Other Ambulatory Visit: Payer: Self-pay

## 2022-11-04 ENCOUNTER — Other Ambulatory Visit: Payer: Self-pay

## 2022-11-04 MED ORDER — TRAMADOL HCL 50 MG PO TABS
50.0000 mg | ORAL_TABLET | Freq: Three times a day (TID) | ORAL | 0 refills | Status: DC | PRN
  Filled 2022-11-04: qty 15, 5d supply, fill #0

## 2022-11-04 MED ORDER — CEPHALEXIN 500 MG PO CAPS
500.0000 mg | ORAL_CAPSULE | Freq: Three times a day (TID) | ORAL | 0 refills | Status: DC
  Filled 2022-11-04: qty 21, 7d supply, fill #0

## 2022-11-04 MED ORDER — TAMSULOSIN HCL 0.4 MG PO CAPS
0.4000 mg | ORAL_CAPSULE | Freq: Every day | ORAL | 0 refills | Status: DC
  Filled 2022-11-04: qty 14, 14d supply, fill #0

## 2022-11-12 ENCOUNTER — Other Ambulatory Visit: Payer: Self-pay

## 2022-11-21 ENCOUNTER — Other Ambulatory Visit: Payer: Self-pay | Admitting: Family Medicine

## 2022-11-21 DIAGNOSIS — Z1231 Encounter for screening mammogram for malignant neoplasm of breast: Secondary | ICD-10-CM

## 2022-11-22 ENCOUNTER — Other Ambulatory Visit (HOSPITAL_COMMUNITY): Payer: Self-pay

## 2022-11-26 ENCOUNTER — Other Ambulatory Visit: Payer: Self-pay

## 2022-11-26 MED ORDER — PHENTERMINE HCL 37.5 MG PO TABS
37.5000 mg | ORAL_TABLET | Freq: Every day | ORAL | 0 refills | Status: DC
  Filled 2022-11-26: qty 30, 30d supply, fill #0

## 2022-12-02 ENCOUNTER — Other Ambulatory Visit: Payer: Self-pay

## 2022-12-06 ENCOUNTER — Ambulatory Visit
Admission: RE | Admit: 2022-12-06 | Discharge: 2022-12-06 | Disposition: A | Discharge status: Home / Self Care | Payer: Managed Care, Other (non HMO) | Source: Ambulatory Visit | Attending: Family Medicine | Admitting: Family Medicine

## 2022-12-06 ENCOUNTER — Other Ambulatory Visit: Payer: Self-pay | Admitting: Family Medicine

## 2022-12-06 DIAGNOSIS — Z1231 Encounter for screening mammogram for malignant neoplasm of breast: Secondary | ICD-10-CM

## 2022-12-10 ENCOUNTER — Other Ambulatory Visit: Payer: Self-pay

## 2022-12-18 ENCOUNTER — Other Ambulatory Visit: Payer: Self-pay | Admitting: Physician Assistant

## 2022-12-18 DIAGNOSIS — S83222A Peripheral tear of medial meniscus, current injury, left knee, initial encounter: Secondary | ICD-10-CM

## 2023-01-06 ENCOUNTER — Other Ambulatory Visit: Payer: Self-pay

## 2023-01-06 MED ORDER — SERTRALINE HCL 50 MG PO TABS
150.0000 mg | ORAL_TABLET | Freq: Every day | ORAL | 4 refills | Status: DC
  Filled 2023-01-06: qty 90, 30d supply, fill #0
  Filled 2023-02-10: qty 90, 30d supply, fill #1
  Filled 2023-03-07: qty 90, 30d supply, fill #2
  Filled 2023-04-07: qty 90, 30d supply, fill #3
  Filled 2023-05-06: qty 90, 30d supply, fill #4

## 2023-01-08 ENCOUNTER — Other Ambulatory Visit: Payer: Self-pay

## 2023-01-08 MED ORDER — SUMATRIPTAN SUCCINATE 25 MG PO TABS
25.0000 mg | ORAL_TABLET | Freq: Every day | ORAL | 3 refills | Status: DC | PRN
  Filled 2023-01-08: qty 9, 15d supply, fill #0
  Filled 2023-02-12: qty 9, 15d supply, fill #1
  Filled 2023-03-21: qty 9, 15d supply, fill #2
  Filled 2023-05-14: qty 9, 15d supply, fill #3
  Filled 2023-05-26 – 2023-09-12 (×3): qty 9, 15d supply, fill #4

## 2023-01-09 ENCOUNTER — Other Ambulatory Visit: Payer: Self-pay

## 2023-01-21 ENCOUNTER — Other Ambulatory Visit: Payer: Self-pay

## 2023-01-21 MED ORDER — PREDNISONE 20 MG PO TABS
40.0000 mg | ORAL_TABLET | Freq: Every day | ORAL | 0 refills | Status: DC
  Filled 2023-01-21: qty 10, 5d supply, fill #0

## 2023-01-21 MED ORDER — PAXLOVID (300/100) 20 X 150 MG & 10 X 100MG PO TBPK
ORAL_TABLET | ORAL | 0 refills | Status: DC
  Filled 2023-01-21: qty 30, 5d supply, fill #0

## 2023-01-24 ENCOUNTER — Other Ambulatory Visit: Payer: Self-pay

## 2023-01-24 MED ORDER — SYNTHROID 100 MCG PO TABS
ORAL_TABLET | ORAL | 1 refills | Status: DC
  Filled 2023-01-24: qty 10, 10d supply, fill #0

## 2023-02-10 ENCOUNTER — Other Ambulatory Visit: Payer: Self-pay

## 2023-02-12 ENCOUNTER — Other Ambulatory Visit: Payer: Self-pay

## 2023-02-13 ENCOUNTER — Other Ambulatory Visit: Payer: Self-pay

## 2023-02-14 ENCOUNTER — Other Ambulatory Visit: Payer: Self-pay

## 2023-02-14 MED ORDER — FLUTICASONE PROPIONATE 50 MCG/ACT NA SUSP
2.0000 | Freq: Every day | NASAL | 1 refills | Status: DC
  Filled 2023-02-14: qty 16, 30d supply, fill #0

## 2023-02-14 MED ORDER — AZITHROMYCIN 250 MG PO TABS
ORAL_TABLET | ORAL | 0 refills | Status: AC
  Filled 2023-02-14: qty 6, 5d supply, fill #0

## 2023-02-16 ENCOUNTER — Other Ambulatory Visit: Payer: Self-pay

## 2023-02-17 ENCOUNTER — Other Ambulatory Visit: Payer: Self-pay

## 2023-02-18 ENCOUNTER — Other Ambulatory Visit: Payer: Self-pay

## 2023-02-20 ENCOUNTER — Other Ambulatory Visit: Payer: Self-pay

## 2023-02-26 ENCOUNTER — Other Ambulatory Visit: Payer: Self-pay

## 2023-02-26 MED ORDER — AMOXICILLIN 500 MG PO CAPS
500.0000 mg | ORAL_CAPSULE | Freq: Two times a day (BID) | ORAL | 0 refills | Status: DC
  Filled 2023-02-26: qty 20, 10d supply, fill #0

## 2023-03-07 ENCOUNTER — Other Ambulatory Visit: Payer: Self-pay

## 2023-03-11 ENCOUNTER — Other Ambulatory Visit: Payer: Self-pay

## 2023-03-11 MED ORDER — AMOXICILLIN-POT CLAVULANATE 875-125 MG PO TABS
875.0000 mg | ORAL_TABLET | Freq: Two times a day (BID) | ORAL | 0 refills | Status: DC
  Filled 2023-03-11: qty 20, 10d supply, fill #0

## 2023-03-11 MED ORDER — ONDANSETRON 4 MG PO TBDP
4.0000 mg | ORAL_TABLET | Freq: Three times a day (TID) | ORAL | 0 refills | Status: AC | PRN
  Filled 2023-03-11: qty 20, 7d supply, fill #0

## 2023-03-21 ENCOUNTER — Other Ambulatory Visit: Payer: Self-pay

## 2023-03-21 MED ORDER — DICYCLOMINE HCL 10 MG PO CAPS
10.0000 mg | ORAL_CAPSULE | Freq: Three times a day (TID) | ORAL | 1 refills | Status: AC
  Filled 2023-03-21: qty 120, 30d supply, fill #0

## 2023-03-24 ENCOUNTER — Other Ambulatory Visit: Payer: Self-pay

## 2023-03-25 ENCOUNTER — Other Ambulatory Visit: Payer: Self-pay

## 2023-04-07 ENCOUNTER — Other Ambulatory Visit: Payer: Self-pay

## 2023-04-18 ENCOUNTER — Other Ambulatory Visit: Payer: Self-pay

## 2023-04-18 MED ORDER — PROPRANOLOL HCL 10 MG PO TABS
10.0000 mg | ORAL_TABLET | Freq: Two times a day (BID) | ORAL | 11 refills | Status: AC
  Filled 2023-04-18: qty 60, 30d supply, fill #0
  Filled 2023-06-20 – 2023-07-03 (×2): qty 60, 30d supply, fill #1
  Filled 2023-08-21: qty 60, 30d supply, fill #2
  Filled 2023-10-20: qty 60, 30d supply, fill #3
  Filled 2023-12-08: qty 60, 30d supply, fill #4

## 2023-04-18 MED ORDER — SUMATRIPTAN SUCCINATE 25 MG PO TABS
ORAL_TABLET | ORAL | 2 refills | Status: DC
Start: 2023-04-18 — End: 2023-10-22
  Filled 2023-04-18: qty 9, 15d supply, fill #0
  Filled 2023-06-06: qty 9, 15d supply, fill #1
  Filled 2023-07-03: qty 9, 15d supply, fill #2
  Filled 2023-07-28: qty 9, 15d supply, fill #3
  Filled 2023-08-27: qty 9, 15d supply, fill #4

## 2023-05-06 ENCOUNTER — Other Ambulatory Visit: Payer: Self-pay

## 2023-05-07 ENCOUNTER — Other Ambulatory Visit: Payer: Self-pay | Admitting: Neurology

## 2023-05-07 DIAGNOSIS — G43709 Chronic migraine without aura, not intractable, without status migrainosus: Secondary | ICD-10-CM

## 2023-05-08 ENCOUNTER — Encounter: Payer: Self-pay | Admitting: Neurology

## 2023-05-14 ENCOUNTER — Other Ambulatory Visit: Payer: Self-pay

## 2023-05-26 ENCOUNTER — Other Ambulatory Visit: Payer: Self-pay

## 2023-05-28 ENCOUNTER — Other Ambulatory Visit: Payer: Self-pay

## 2023-05-28 MED ORDER — FUROSEMIDE 20 MG PO TABS
20.0000 mg | ORAL_TABLET | Freq: Every day | ORAL | 3 refills | Status: AC | PRN
  Filled 2023-05-28: qty 90, 90d supply, fill #0

## 2023-05-30 ENCOUNTER — Other Ambulatory Visit: Payer: Self-pay

## 2023-05-30 MED ORDER — LIDOCAINE-PRILOCAINE 2.5-2.5 % EX CREA
TOPICAL_CREAM | CUTANEOUS | 0 refills | Status: AC | PRN
  Filled 2023-05-30 – 2023-06-24 (×2): qty 30, 30d supply, fill #0

## 2023-06-03 ENCOUNTER — Other Ambulatory Visit: Payer: Self-pay

## 2023-06-06 ENCOUNTER — Other Ambulatory Visit: Payer: Self-pay

## 2023-06-06 MED ORDER — SERTRALINE HCL 50 MG PO TABS
150.0000 mg | ORAL_TABLET | Freq: Every day | ORAL | 0 refills | Status: DC
  Filled 2023-06-06: qty 90, 30d supply, fill #0

## 2023-06-06 MED ORDER — VALACYCLOVIR HCL 500 MG PO TABS
500.0000 mg | ORAL_TABLET | Freq: Two times a day (BID) | ORAL | 0 refills | Status: AC
  Filled 2023-06-06: qty 6, 3d supply, fill #0

## 2023-06-20 ENCOUNTER — Other Ambulatory Visit: Payer: Self-pay

## 2023-06-24 ENCOUNTER — Other Ambulatory Visit: Payer: Self-pay

## 2023-07-03 ENCOUNTER — Other Ambulatory Visit: Payer: Self-pay

## 2023-07-03 MED ORDER — SERTRALINE HCL 50 MG PO TABS
150.0000 mg | ORAL_TABLET | Freq: Every day | ORAL | 0 refills | Status: DC
  Filled 2023-07-03: qty 90, 30d supply, fill #0

## 2023-07-28 ENCOUNTER — Other Ambulatory Visit: Payer: Self-pay

## 2023-08-05 ENCOUNTER — Other Ambulatory Visit: Payer: Self-pay

## 2023-08-06 ENCOUNTER — Other Ambulatory Visit: Payer: Self-pay

## 2023-08-06 MED ORDER — PANTOPRAZOLE SODIUM 40 MG PO TBEC
40.0000 mg | DELAYED_RELEASE_TABLET | Freq: Every day | ORAL | 0 refills | Status: DC
  Filled 2023-08-06 (×3): qty 90, 90d supply, fill #0

## 2023-08-08 ENCOUNTER — Other Ambulatory Visit: Payer: Self-pay

## 2023-08-12 ENCOUNTER — Other Ambulatory Visit: Payer: Self-pay

## 2023-08-12 MED ORDER — SERTRALINE HCL 50 MG PO TABS
150.0000 mg | ORAL_TABLET | Freq: Every day | ORAL | 0 refills | Status: DC
  Filled 2023-08-12: qty 90, 30d supply, fill #0

## 2023-08-18 ENCOUNTER — Other Ambulatory Visit: Payer: Self-pay | Admitting: Family Medicine

## 2023-08-18 DIAGNOSIS — Z1231 Encounter for screening mammogram for malignant neoplasm of breast: Secondary | ICD-10-CM

## 2023-08-19 ENCOUNTER — Other Ambulatory Visit: Payer: Self-pay

## 2023-08-19 MED ORDER — SPIRONOLACTONE 25 MG PO TABS
25.0000 mg | ORAL_TABLET | Freq: Every day | ORAL | 11 refills | Status: DC
  Filled 2023-08-19: qty 30, 30d supply, fill #0
  Filled 2023-09-16: qty 30, 30d supply, fill #1
  Filled 2023-10-13: qty 30, 30d supply, fill #2

## 2023-08-21 ENCOUNTER — Other Ambulatory Visit: Payer: Self-pay | Admitting: Family Medicine

## 2023-08-21 DIAGNOSIS — R079 Chest pain, unspecified: Secondary | ICD-10-CM

## 2023-08-21 DIAGNOSIS — Z0000 Encounter for general adult medical examination without abnormal findings: Secondary | ICD-10-CM

## 2023-08-27 ENCOUNTER — Other Ambulatory Visit: Payer: Self-pay

## 2023-09-03 ENCOUNTER — Other Ambulatory Visit

## 2023-09-03 ENCOUNTER — Other Ambulatory Visit: Payer: Self-pay

## 2023-09-03 MED ORDER — NITROFURANTOIN MONOHYD MACRO 100 MG PO CAPS
100.0000 mg | ORAL_CAPSULE | Freq: Two times a day (BID) | ORAL | 0 refills | Status: DC
  Filled 2023-09-03: qty 10, 5d supply, fill #0

## 2023-09-11 ENCOUNTER — Other Ambulatory Visit: Payer: Self-pay

## 2023-09-11 ENCOUNTER — Other Ambulatory Visit

## 2023-09-12 ENCOUNTER — Other Ambulatory Visit: Payer: Self-pay

## 2023-09-12 MED ORDER — SERTRALINE HCL 50 MG PO TABS
150.0000 mg | ORAL_TABLET | Freq: Every day | ORAL | 3 refills | Status: DC
  Filled 2023-09-12: qty 270, 90d supply, fill #0

## 2023-09-16 ENCOUNTER — Other Ambulatory Visit: Payer: Self-pay

## 2023-09-25 ENCOUNTER — Other Ambulatory Visit: Payer: Self-pay

## 2023-10-01 DIAGNOSIS — R3129 Other microscopic hematuria: Secondary | ICD-10-CM | POA: Insufficient documentation

## 2023-10-01 NOTE — Progress Notes (Signed)
 10/06/23 8:56 AM   Colleen Phillips [birth year] [id number]  CC: microhematuria   HPI: 53 year old female here for initial evaluation of microhematuria and 3 weeks of left flank pain Appears to have referral from July 2025-asymptomatic microhematuria  Today, we had a pleasant conversation.  She describes 3 weeks of left flank pain radiating to left groin No acute lower urinary tract symptoms, denies dysuria, urgency, frequency, new incontinence She is worried that this feels similar to a stone event 4 years ago UA today-11-30 WBC, 11-30 RBC, few bacteria  Hx of kidneys stones, spontaneous passage 4 years ago, no history of GU surgeries   PMH: Past Medical History:  Diagnosis Date   Anxiety    Hypertension    Thyroid  disease     Surgical History: Past Surgical History:  Procedure Laterality Date   ABDOMINAL HYSTERECTOMY     AUGMENTATION MAMMAPLASTY     CHOLECYSTECTOMY     DILATION AND CURETTAGE OF UTERUS      Family History: No family history on file.  Social History:  reports that she has quit smoking. She has never used smokeless tobacco. She reports current alcohol use. She reports that she does not use drugs.      Physical Exam: BP 107/72   Pulse 67   Ht 5' 6 (1.676 m)   Wt 158 lb (71.7 kg)   BMI 25.50 kg/m    Constitutional:  Alert and oriented, No acute distress. Cardiovascular: No clubbing, cyanosis, or edema. Respiratory: Normal respiratory effort, no increased work of breathing. GI: Nondistended Skin: No rashes, bruises or suspicious lesions. Neurologic: Grossly intact, no focal deficits, moving all 4 extremities. Psychiatric: Normal mood and affect.  Laboratory Data: UA (10/2022 and 08/2022) - +RBC (11-88), 5-10 WBC, no obvious infection, no culture data   Pertinent Imaging: N/A    Assessment & Plan:    Microhematuria Assessment & Plan: Chronic AMH, has not had workup Subacute left flank pain x 3 weeks, UA possible infection Need to  rule out nephrolithiasis  Asymptomatic microhematuria (AMH) is defined by the AUA as >=3 RBC/HPF on a single properly collected urinalysis, excluding benign causes. Current AUA (2020, amended 2025) recommend risk stratification into low, intermediate, and high groups based on age, sex, smoking history, degree of hematuria, and other urothelial cancer risk factors. This patient classifies as intermediate-risk AMH. Workup for [facility] is directed by risk stratification and shared patient decision making, tailored to balance early detection of urothelial malignancy with avoidance of necessary or invasive testing.   Plan: - CT urogram to evaluate (symptomatic hematuria- I suspect UTI vs stone)  - hold on cystoscopy unless no identifiable etiology - Send reflex urine culture   Orders: -     Urinalysis, Complete -     CULTURE, URINE COMPREHENSIVE -     Nitrofurantoin  Monohyd Macro; Take 1 capsule (100 mg total) by mouth every 12 (twelve) hours for 7 days.  Dispense: 14 capsule; Refill: 0 -     CT HEMATURIA WORKUP; Future  Acute cystitis with hematuria Assessment & Plan: Possible UTI with GH, left flank pain  Possible stone event, as above  - send for urine culture  - Empiric Macrobid  x 7 days   - tailor to culture results  Orders: -     Nitrofurantoin  Monohyd Macro; Take 1 capsule (100 mg total) by mouth every 12 (twelve) hours for 7 days.  Dispense: 14 capsule; Refill: 0 -     CT HEMATURIA WORKUP; Future  Penne Skye, MD 10/06/2023  [facility] [address] [place], [place] [address] [phone]

## 2023-10-01 NOTE — Assessment & Plan Note (Addendum)
 Chronic AMH, has not had workup Subacute left flank pain x 3 weeks, UA possible infection Need to rule out nephrolithiasis  Asymptomatic microhematuria (AMH) is defined by the AUA as >=3 RBC/HPF on a single properly collected urinalysis, excluding benign causes. Current AUA (2020, amended 2025) recommend risk stratification into low, intermediate, and high groups based on age, sex, smoking history, degree of hematuria, and other urothelial cancer risk factors. This patient classifies as intermediate-risk AMH. Workup for [facility] is directed by risk stratification and shared patient decision making, tailored to balance early detection of urothelial malignancy with avoidance of necessary or invasive testing.   Plan: - CT urogram to evaluate (symptomatic hematuria- I suspect UTI vs stone)  - hold on cystoscopy unless no identifiable etiology - Send reflex urine culture

## 2023-10-02 ENCOUNTER — Other Ambulatory Visit

## 2023-10-03 ENCOUNTER — Other Ambulatory Visit: Payer: Self-pay | Admitting: Family Medicine

## 2023-10-03 DIAGNOSIS — Z0000 Encounter for general adult medical examination without abnormal findings: Secondary | ICD-10-CM

## 2023-10-03 DIAGNOSIS — R079 Chest pain, unspecified: Secondary | ICD-10-CM

## 2023-10-06 ENCOUNTER — Ambulatory Visit: Admitting: Urology

## 2023-10-06 ENCOUNTER — Other Ambulatory Visit: Payer: Self-pay

## 2023-10-06 VITALS — BP 107/72 | HR 67 | Ht 66.0 in | Wt 158.0 lb

## 2023-10-06 DIAGNOSIS — R3129 Other microscopic hematuria: Secondary | ICD-10-CM

## 2023-10-06 DIAGNOSIS — N3001 Acute cystitis with hematuria: Secondary | ICD-10-CM | POA: Diagnosis not present

## 2023-10-06 DIAGNOSIS — N390 Urinary tract infection, site not specified: Secondary | ICD-10-CM | POA: Insufficient documentation

## 2023-10-06 LAB — MICROSCOPIC EXAMINATION

## 2023-10-06 LAB — URINALYSIS, COMPLETE
Bilirubin, UA: NEGATIVE
Glucose, UA: NEGATIVE
Ketones, UA: NEGATIVE
Nitrite, UA: NEGATIVE
Specific Gravity, UA: 1.025 (ref 1.005–1.030)
Urobilinogen, Ur: 0.2 mg/dL (ref 0.2–1.0)
pH, UA: 6 (ref 5.0–7.5)

## 2023-10-06 MED ORDER — NITROFURANTOIN MONOHYD MACRO 100 MG PO CAPS
100.0000 mg | ORAL_CAPSULE | Freq: Two times a day (BID) | ORAL | 0 refills | Status: AC
  Filled 2023-10-06: qty 14, 7d supply, fill #0

## 2023-10-06 NOTE — Patient Instructions (Signed)
 Scheduling number: [phone]

## 2023-10-06 NOTE — Assessment & Plan Note (Signed)
 Possible UTI with GH, left flank pain  Possible stone event, as above  - send for urine culture  - Empiric Macrobid  x 7 days   - tailor to culture results

## 2023-10-08 NOTE — Addendum Note (Signed)
 Addended byBETHA CORIE PLATER on: 10/08/2023 08:17 AM   Modules accepted: Orders

## 2023-10-10 ENCOUNTER — Ambulatory Visit
Admission: RE | Admit: 2023-10-10 | Discharge: 2023-10-10 | Disposition: A | Discharge status: Home / Self Care | Payer: Self-pay | Source: Ambulatory Visit | Attending: Family Medicine | Admitting: Family Medicine

## 2023-10-10 DIAGNOSIS — R079 Chest pain, unspecified: Secondary | ICD-10-CM | POA: Insufficient documentation

## 2023-10-10 DIAGNOSIS — Z0000 Encounter for general adult medical examination without abnormal findings: Secondary | ICD-10-CM | POA: Insufficient documentation

## 2023-10-10 LAB — CULTURE, URINE COMPREHENSIVE

## 2023-10-13 ENCOUNTER — Other Ambulatory Visit (HOSPITAL_COMMUNITY): Payer: Self-pay

## 2023-10-13 ENCOUNTER — Other Ambulatory Visit: Payer: Self-pay

## 2023-10-14 ENCOUNTER — Other Ambulatory Visit: Payer: Self-pay

## 2023-10-14 MED ORDER — SPIRONOLACTONE 25 MG PO TABS
25.0000 mg | ORAL_TABLET | Freq: Every day | ORAL | 3 refills | Status: AC
  Filled 2023-10-14: qty 90, 90d supply, fill #0
  Filled 2024-01-23: qty 90, 90d supply, fill #1

## 2023-10-16 ENCOUNTER — Other Ambulatory Visit: Payer: Self-pay | Admitting: Urology

## 2023-10-16 ENCOUNTER — Encounter: Payer: Self-pay | Admitting: Intensive Care

## 2023-10-16 ENCOUNTER — Emergency Department

## 2023-10-16 ENCOUNTER — Telehealth: Payer: Self-pay

## 2023-10-16 ENCOUNTER — Observation Stay
Admission: EM | Admit: 2023-10-16 | Discharge: 2023-10-17 | Disposition: A | Discharge status: Home / Self Care | Source: Ambulatory Visit | Attending: Internal Medicine | Admitting: Internal Medicine

## 2023-10-16 ENCOUNTER — Other Ambulatory Visit: Payer: Self-pay

## 2023-10-16 DIAGNOSIS — R1032 Left lower quadrant pain: Secondary | ICD-10-CM | POA: Diagnosis present

## 2023-10-16 DIAGNOSIS — L639 Alopecia areata, unspecified: Secondary | ICD-10-CM | POA: Diagnosis not present

## 2023-10-16 DIAGNOSIS — E039 Hypothyroidism, unspecified: Secondary | ICD-10-CM | POA: Insufficient documentation

## 2023-10-16 DIAGNOSIS — G43909 Migraine, unspecified, not intractable, without status migrainosus: Secondary | ICD-10-CM | POA: Diagnosis not present

## 2023-10-16 DIAGNOSIS — Z79899 Other long term (current) drug therapy: Secondary | ICD-10-CM | POA: Insufficient documentation

## 2023-10-16 DIAGNOSIS — N201 Calculus of ureter: Secondary | ICD-10-CM

## 2023-10-16 DIAGNOSIS — N139 Obstructive and reflux uropathy, unspecified: Secondary | ICD-10-CM

## 2023-10-16 DIAGNOSIS — F1090 Alcohol use, unspecified, uncomplicated: Secondary | ICD-10-CM | POA: Diagnosis not present

## 2023-10-16 DIAGNOSIS — N132 Hydronephrosis with renal and ureteral calculous obstruction: Principal | ICD-10-CM | POA: Insufficient documentation

## 2023-10-16 DIAGNOSIS — F17210 Nicotine dependence, cigarettes, uncomplicated: Secondary | ICD-10-CM | POA: Insufficient documentation

## 2023-10-16 DIAGNOSIS — N200 Calculus of kidney: Principal | ICD-10-CM

## 2023-10-16 DIAGNOSIS — I10 Essential (primary) hypertension: Secondary | ICD-10-CM | POA: Insufficient documentation

## 2023-10-16 DIAGNOSIS — N138 Other obstructive and reflux uropathy: Secondary | ICD-10-CM

## 2023-10-16 LAB — URINALYSIS, ROUTINE W REFLEX MICROSCOPIC
Bacteria, UA: NONE SEEN
Bilirubin Urine: NEGATIVE
Glucose, UA: NEGATIVE mg/dL
Ketones, ur: NEGATIVE mg/dL
Leukocytes,Ua: NEGATIVE
Nitrite: NEGATIVE
Protein, ur: NEGATIVE mg/dL
RBC / HPF: 0 RBC/hpf (ref 0–5)
Specific Gravity, Urine: 1 — ABNORMAL LOW (ref 1.005–1.030)
Squamous Epithelial / HPF: 0 /HPF (ref 0–5)
pH: 7 (ref 5.0–8.0)

## 2023-10-16 LAB — CBC WITH DIFFERENTIAL/PLATELET
Abs Immature Granulocytes: 0.09 K/uL — ABNORMAL HIGH (ref 0.00–0.07)
Basophils Absolute: 0 K/uL (ref 0.0–0.1)
Basophils Relative: 1 %
Eosinophils Absolute: 0.3 K/uL (ref 0.0–0.5)
Eosinophils Relative: 4 %
HCT: 43.2 % (ref 36.0–46.0)
Hemoglobin: 14.4 g/dL (ref 12.0–15.0)
Immature Granulocytes: 1 %
Lymphocytes Relative: 24 %
Lymphs Abs: 1.9 K/uL (ref 0.7–4.0)
MCH: 29.6 pg (ref 26.0–34.0)
MCHC: 33.3 g/dL (ref 30.0–36.0)
MCV: 88.9 fL (ref 80.0–100.0)
Monocytes Absolute: 0.5 K/uL (ref 0.1–1.0)
Monocytes Relative: 6 %
Neutro Abs: 5 K/uL (ref 1.7–7.7)
Neutrophils Relative %: 64 %
Platelets: 200 K/uL (ref 150–400)
RBC: 4.86 MIL/uL (ref 3.87–5.11)
RDW: 12.8 % (ref 11.5–15.5)
WBC: 7.8 K/uL (ref 4.0–10.5)
nRBC: 0 % (ref 0.0–0.2)

## 2023-10-16 LAB — COMPREHENSIVE METABOLIC PANEL WITH GFR
ALT: 18 U/L (ref 0–44)
AST: 17 U/L (ref 15–41)
Albumin: 4.3 g/dL (ref 3.5–5.0)
Alkaline Phosphatase: 54 U/L (ref 38–126)
Anion gap: 8 (ref 5–15)
BUN: 19 mg/dL (ref 6–20)
CO2: 26 mmol/L (ref 22–32)
Calcium: 9.2 mg/dL (ref 8.9–10.3)
Chloride: 103 mmol/L (ref 98–111)
Creatinine, Ser: 0.9 mg/dL (ref 0.44–1.00)
GFR, Estimated: >60 mL/min (ref >60)
Glucose, Bld: 98 mg/dL (ref 70–99)
Potassium: 3.7 mmol/L (ref 3.5–5.1)
Sodium: 137 mmol/L (ref 135–145)
Total Bilirubin: 0.7 mg/dL (ref 0.0–1.2)
Total Protein: 7.4 g/dL (ref 6.5–8.1)

## 2023-10-16 MED ORDER — SENNOSIDES-DOCUSATE SODIUM 8.6-50 MG PO TABS: 1.0000 | ORAL_TABLET | Freq: Every evening | ORAL | Status: DC | PRN

## 2023-10-16 MED ORDER — ACETAMINOPHEN 650 MG RE SUPP: 650.0000 mg | Freq: Four times a day (QID) | RECTAL | Status: DC | PRN

## 2023-10-16 MED ORDER — LORAZEPAM 1 MG PO TABS
1.0000 mg | ORAL_TABLET | Freq: Once | ORAL | Status: AC
  Administered 2023-10-16: 1 mg via ORAL
  Filled 2023-10-16: qty 1

## 2023-10-16 MED ORDER — ACETAMINOPHEN 325 MG PO TABS: 650.0000 mg | ORAL_TABLET | Freq: Four times a day (QID) | ORAL | Status: DC | PRN

## 2023-10-16 MED ORDER — KETOROLAC TROMETHAMINE 30 MG/ML IJ SOLN: 30.0000 mg | Freq: Four times a day (QID) | INTRAMUSCULAR | Status: DC | PRN

## 2023-10-16 MED ORDER — SERTRALINE HCL 50 MG PO TABS
150.0000 mg | ORAL_TABLET | Freq: Every day | ORAL | Status: DC
  Administered 2023-10-16: 150 mg via ORAL
  Filled 2023-10-16: qty 3

## 2023-10-16 MED ORDER — TAMSULOSIN HCL 0.4 MG PO CAPS
0.4000 mg | ORAL_CAPSULE | Freq: Every day | ORAL | Status: DC
  Administered 2023-10-16 – 2023-10-17 (×2): 0.4 mg via ORAL
  Filled 2023-10-16 (×2): qty 1

## 2023-10-16 MED ORDER — SPIRONOLACTONE 25 MG PO TABS
25.0000 mg | ORAL_TABLET | Freq: Every day | ORAL | Status: DC
  Administered 2023-10-16: 25 mg via ORAL
  Filled 2023-10-16: qty 1

## 2023-10-16 MED ORDER — KETOROLAC TROMETHAMINE 15 MG/ML IJ SOLN
15.0000 mg | Freq: Once | INTRAMUSCULAR | Status: AC
  Administered 2023-10-16: 15 mg via INTRAVENOUS
  Filled 2023-10-16: qty 1

## 2023-10-16 MED ORDER — LEVOTHYROXINE SODIUM 25 MCG PO TABS
125.0000 ug | ORAL_TABLET | Freq: Every day | ORAL | Status: DC
  Administered 2023-10-17: 125 ug via ORAL
  Filled 2023-10-16: qty 1

## 2023-10-16 MED ORDER — ONDANSETRON HCL 4 MG PO TABS: 4.0000 mg | ORAL_TABLET | Freq: Four times a day (QID) | ORAL | Status: DC | PRN

## 2023-10-16 MED ORDER — ONDANSETRON HCL 4 MG/2ML IJ SOLN
4.0000 mg | Freq: Four times a day (QID) | INTRAMUSCULAR | Status: DC | PRN
  Administered 2023-10-17: 4 mg via INTRAVENOUS

## 2023-10-16 MED ORDER — PROPRANOLOL HCL 10 MG PO TABS
10.0000 mg | ORAL_TABLET | Freq: Two times a day (BID) | ORAL | Status: DC
  Administered 2023-10-17: 10 mg via ORAL
  Filled 2023-10-16 (×5): qty 1

## 2023-10-16 MED ORDER — DICYCLOMINE HCL 10 MG PO CAPS
10.0000 mg | ORAL_CAPSULE | Freq: Three times a day (TID) | ORAL | Status: DC
  Administered 2023-10-16: 10 mg via ORAL
  Filled 2023-10-16: qty 1

## 2023-10-16 MED ORDER — OXYCODONE HCL 5 MG PO TABS
5.0000 mg | ORAL_TABLET | ORAL | Status: DC | PRN
  Administered 2023-10-16 – 2023-10-17 (×3): 5 mg via ORAL
  Filled 2023-10-16 (×3): qty 1

## 2023-10-16 MED ORDER — PANTOPRAZOLE SODIUM 40 MG PO TBEC
40.0000 mg | DELAYED_RELEASE_TABLET | Freq: Every day | ORAL | Status: DC
  Administered 2023-10-16: 40 mg via ORAL
  Filled 2023-10-16: qty 1

## 2023-10-16 MED ORDER — SODIUM CHLORIDE 0.9 % IV BOLUS
1000.0000 mL | Freq: Once | INTRAVENOUS | Status: AC
  Administered 2023-10-16: 1000 mL via INTRAVENOUS

## 2023-10-16 MED ORDER — SUMATRIPTAN SUCCINATE 50 MG PO TABS
25.0000 mg | ORAL_TABLET | ORAL | Status: DC | PRN
Start: 2023-10-16 — End: 2023-10-17

## 2023-10-16 MED ORDER — SODIUM CHLORIDE 0.9 % IV SOLN: INTRAVENOUS | Status: DC

## 2023-10-16 NOTE — H&P (Signed)
 History and Physical    Colleen Phillips FMW:[id number] DOB: May 02, 1970 DOA: 10/16/2023  PCP: Cyrus Selinda Moose, PA-C (Confirm with patient/family/NH records and if not entered, this has to be entered at [facility] point of entry) Patient coming from: Home  I have personally briefly reviewed patient's old medical records in [facility]  Chief Complaint: Left flank pain  HPI: Colleen Phillips is a 53 y.o. female with medical history significant of rheumatoid arthritis, HTN, migraines, hypothyroidism, presented with worsening of left flank pain.  Patient started to have sharp like left flank pain radiating to groin area about 3 to 4 weeks ago, intermittent, she had dysuria already states she developed cold symptoms and was treated for UTI.  This morning, patient woke up with persistent left flank pain radiating to left groin area without any dysuria.  No fever or chills.  ED Course: Afebrile, nontachycardic blood pressure 150/81, saturation 100% on room air, CT renal stone study showed a left 8 mm distal left ureteral stone and moderate left hydroureteronephrosis.  Patient was given IV fluids multiple rounds of Toradol   Review of Systems: As per HPI otherwise 14 point review of systems negative.   Past Medical History:  Diagnosis Date   Anxiety    Hypertension    Thyroid  disease     Past Surgical History:  Procedure Laterality Date   ABDOMINAL HYSTERECTOMY     AUGMENTATION MAMMAPLASTY     CHOLECYSTECTOMY     DILATION AND CURETTAGE OF UTERUS       reports that she has been smoking cigarettes. She has never used smokeless tobacco. She reports current alcohol use. She reports that she does not use drugs.  Allergies  Allergen Reactions   Doxycycline Anaphylaxis    History reviewed. No pertinent family history.   Prior to Admission medications   Medication Sig Start Date End Date Taking? Authorizing Provider  dicyclomine  (BENTYL ) 10 MG capsule Take 1 capsule (10 mg total) by  mouth 4 (four) times daily -  before meals and at bedtime. 03/21/23  Yes   furosemide  (LASIX ) 20 MG tablet Take 1 tablet (20 mg total) by mouth once daily as needed for Edema 05/28/23  Yes   levothyroxine  (SYNTHROID ) 125 MCG tablet Take 125 mcg by mouth daily at 6 (six) AM.   Yes [provider]  lidocaine -prilocaine  (EMLA ) cream Apply topically as needed as directed. 05/30/23  Yes   ondansetron  (ZOFRAN -ODT) 4 MG disintegrating tablet Take 1 tablet (4 mg total) by mouth every 8 (eight) hours as needed for nausea for up to 7 days. 03/11/23  Yes   pantoprazole  (PROTONIX ) 40 MG tablet Take 1 tablet (40 mg total) by mouth daily. 08/06/23  Yes   propranolol  (INDERAL ) 10 MG tablet Take 1 tablet (10 mg total) by mouth 2 (two) times daily 04/18/23  Yes   semaglutide-weight management (WEGOVY) 0.5 MG/0.5ML SOAJ SQ injection Inject 0.5 mg into the skin once a week.   Yes [provider]  sertraline  (ZOLOFT ) 50 MG tablet Take 3 tablets (150 mg total) by mouth daily. 05/09/22  Yes   spironolactone  (ALDACTONE ) 25 MG tablet Take 1 tablet (25 mg total) by mouth daily. 10/14/23  Yes   SUMAtriptan  (IMITREX ) 25 MG tablet Take 1 tablet (25 mg total) by mouth once as needed for migraine for up to 1 dose. May take a second dose after 2 hours if needed. 04/18/23  Yes   valACYclovir  (VALTREX ) 500 MG tablet Take 1 tablet (500 mg total) by  mouth 2 (two) times daily for 3 days 06/06/23  Yes   fluticasone  (FLONASE ) 50 MCG/ACT nasal spray Place 2 sprays into both nostrils daily. Patient not taking: Reported on 10/16/2023 02/14/23     folic acid  (FOLVITE ) 1 MG tablet Take 1 tablet (1 mg total) by mouth once daily Patient not taking: Reported on 10/16/2023 11/09/21     methotrexate  (RHEUMATREX) 2.5 MG tablet Take 6 tablets (15 mg total) by mouth every 7 (seven) days Patient not taking: Reported on 10/16/2023 11/09/21     metoprolol  succinate (TOPROL -XL) 25 MG 24 hr tablet TAKE ONE TABLET BY MOUTH ONCE DAILY Patient not  taking: Reported on 10/16/2023 01/08/15   Chrismon, Marinda BRAVO, PA-C  nitroGLYCERIN  (NITROSTAT ) 0.4 MG SL tablet Place under the tongue. Patient not taking: Reported on 10/16/2023 09/01/20     Tapinarof  (VTAMA ) 1 % CREA Apply 1 application topically daily. Patient not taking: Reported on 10/16/2023 02/20/21   Moye, [place] , MD  omeprazole  (PRILOSEC) 20 MG capsule Take 1 capsule (20 mg total) by mouth daily. 05/01/22 12/18/22      Physical Exam: Vitals:   10/16/23 1100 10/16/23 1101 10/16/23 1132 10/16/23 1451  BP: (!) 154/81     Pulse: 77     Resp: 18     Temp: 98.2 F (36.8 C)   98.1 F (36.7 C)  TempSrc: Oral   Oral  SpO2: 100%  100%   Weight:  70.3 kg    Height:  5' 6 (1.676 m)      Constitutional: NAD, calm, comfortable Vitals:   10/16/23 1100 10/16/23 1101 10/16/23 1132 10/16/23 1451  BP: (!) 154/81     Pulse: 77     Resp: 18     Temp: 98.2 F (36.8 C)   98.1 F (36.7 C)  TempSrc: Oral   Oral  SpO2: 100%  100%   Weight:  70.3 kg    Height:  5' 6 (1.676 m)     Eyes: PERRL, lids and conjunctivae normal ENMT: Mucous membranes are moist. Posterior pharynx clear of any exudate or lesions.Normal dentition.  Neck: normal, supple, no masses, no thyromegaly Respiratory: clear to auscultation bilaterally, no wheezing, no crackles. Normal respiratory effort. No accessory muscle use.  Cardiovascular: Regular rate and rhythm, no murmurs / rubs / gallops. No extremity edema. 2+ pedal pulses. No carotid bruits.  Abdomen: left CVA tenderness, no masses palpated. No hepatosplenomegaly. Bowel sounds positive.  Musculoskeletal: no clubbing / cyanosis. No joint deformity upper and lower extremities. Good ROM, no contractures. Normal muscle tone.  Skin: no rashes, lesions, ulcers. No induration Neurologic: CN 2-12 grossly intact. Sensation intact, DTR normal. Strength 5/5 in all 4.  Psychiatric: Normal judgment and insight. Alert and oriented x 3. Normal mood.    Labs on Admission: I have  personally reviewed following labs and imaging studies  CBC: Recent Labs  Lab 10/16/23 1105  WBC 7.8  NEUTROABS 5.0  HGB 14.4  HCT 43.2  MCV 88.9  PLT 200   Basic Metabolic Panel: Recent Labs  Lab 10/16/23 1105  NA 137  K 3.7  CL 103  CO2 26  GLUCOSE 98  BUN 19  CREATININE 0.90  CALCIUM 9.2   GFR: Estimated Creatinine Clearance: 67.7 mL/min (by C-G formula based on SCr of 0.9 mg/dL). Liver Function Tests: Recent Labs  Lab 10/16/23 1105  AST 17  ALT 18  ALKPHOS 54  BILITOT 0.7  PROT 7.4  ALBUMIN 4.3   No results for input(s): LIPASE, AMYLASE  in the last 168 hours. No results for input(s): AMMONIA in the last 168 hours. Coagulation Profile: No results for input(s): INR, PROTIME in the last 168 hours. Cardiac Enzymes: No results for input(s): CKTOTAL, CKMB, CKMBINDEX, TROPONINI in the last 168 hours. BNP (last 3 results) No results for input(s): PROBNP in the last 8760 hours. HbA1C: No results for input(s): HGBA1C in the last 72 hours. CBG: No results for input(s): GLUCAP in the last 168 hours. Lipid Profile: No results for input(s): CHOL, HDL, LDLCALC, TRIG, CHOLHDL, LDLDIRECT in the last 72 hours. Thyroid  Function Tests: No results for input(s): TSH, T4TOTAL, FREET4, T3FREE, THYROIDAB in the last 72 hours. Anemia Panel: No results for input(s): VITAMINB12, FOLATE, FERRITIN, TIBC, IRON, RETICCTPCT in the last 72 hours. Urine analysis:    Component Value Date/Time   COLORURINE COLORLESS (A) 10/16/2023 1239   APPEARANCEUR CLEAR (A) 10/16/2023 1239   APPEARANCEUR Slightly cloudy 10/06/2023 0823   LABSPEC 1.000 (L) 10/16/2023 1239   LABSPEC 1.013 05/12/2014 2014   PHURINE 7.0 10/16/2023 1239   GLUCOSEU NEGATIVE 10/16/2023 1239   GLUCOSEU Negative 05/12/2014 2014   HGBUR MODERATE (A) 10/16/2023 1239   BILIRUBINUR NEGATIVE 10/16/2023 1239   BILIRUBINUR Negative 10/06/2023 0823   BILIRUBINUR  Negative 05/12/2014 2014   KETONESUR NEGATIVE 10/16/2023 1239   PROTEINUR NEGATIVE 10/16/2023 1239   NITRITE NEGATIVE 10/16/2023 1239   LEUKOCYTESUR NEGATIVE 10/16/2023 1239   LEUKOCYTESUR Negative 05/12/2014 2014    Radiological Exams on Admission: CT Renal Stone Study Result Date: 10/16/2023 CLINICAL DATA:  Left flank pain. EXAM: CT ABDOMEN AND PELVIS WITHOUT CONTRAST TECHNIQUE: Multidetector CT imaging of the abdomen and pelvis was performed following the standard protocol without IV contrast. RADIATION DOSE REDUCTION: This exam was performed according to the departmental dose-optimization program which includes automated exposure control, adjustment of the mA and/or kV according to patient size and/or use of iterative reconstruction technique. COMPARISON:  August 18, 2017. FINDINGS: Lower chest: No acute abnormality. Hepatobiliary: No focal liver abnormality is seen. Status post cholecystectomy. No biliary dilatation. Pancreas: Unremarkable. No pancreatic ductal dilatation or surrounding inflammatory changes. Spleen: Normal in size without focal abnormality. Adrenals/Urinary Tract: Adrenal glands appear normal. Moderate left hydroureteronephrosis is noted secondary to 8 mm calculus in distal right ureter. Urinary bladder is unremarkable. Stomach/Bowel: The stomach is unremarkable. There is no evidence of bowel obstruction or inflammation. The appendix is not clearly visualized. Vascular/Lymphatic: No significant vascular findings are present. No enlarged abdominal or pelvic lymph nodes. Reproductive: Status post hysterectomy. No adnexal masses. Other: No abdominal wall hernia or abnormality. No abdominopelvic ascites. Musculoskeletal: No acute or significant osseous findings. IMPRESSION: Moderate left hydroureteronephrosis is noted secondary to 8 mm calculus in distal left ureter. Electronically Signed   By: Lynwood Landy Raddle M.D.   On: 10/16/2023 11:59    EKG: None  Assessment/Plan Principal  Problem:   Obstructive uropathy  (please populate well all problems here in Problem List. (For example, if patient is on BP meds at home and you resume or decide to hold them, it is a problem that needs to be her. Same for CAD, COPD, HLD and so on)  Left-sided obstructive uropathy Left distal ureteral stone - Symptomatic management tonight, alternate oxycodone  and Toradol  for pain control - IV fluid - OR tomorrow for cystoscopy and ureteral stenting  Migraines - Stable  Hypothyroidism - Continue home medication  Alopecia - On spironolactone   DVT prophylaxis: SCD Code Status: Full code Family Communication: None at present Disposition Plan: Expect less than  2 midnight hospital stay Consults called: Urology Admission status: MedSurg observation   Cort ONEIDA Mana MD [facility] Pager [phone]  10/16/2023, 3:16 PM

## 2023-10-16 NOTE — ED Notes (Signed)
 IN CT, STAFF Notified to bring patient to room 49 when completed.

## 2023-10-16 NOTE — Telephone Encounter (Signed)
 Called pt after seeing a mychart message from her. Pt was originally treated for a poss UTI but it wasn't ruled out a stone was the cause. Pt hasn't gotten a scan yet to confirm stone. Pt had complaints today of severe left sided pain 7-8 out of 10 and tramadol  wasn't helping it at all. Pain was radiating from left side to front and felt like contractions. After confirming with Dr. Georganne we suggested her being seen in the ER today. Pt understood and stated she will head there now.

## 2023-10-16 NOTE — H&P (View-Only) (Signed)
     Urology Consult   I have been asked to see the patient by Dr. Floy, for evaluation and management of obstructing ureteral stone.  Chief Complaint: acute left flank pain  HPI:  Colleen Phillips is a 53 y.o. year old female, presented to the ED 10/16/2023 Recently in my clinic last week, history of intermittent left flank pain-I had recommended CT scan but her pain resolved and she deferred imaging Acute left flank pain returned today, quite severe, refractory to tramadol  CT stone-8 mm left distal ureteral stone with proximal hydroureteronephrosis    PMH: Past Medical History:  Diagnosis Date   Anxiety    Hypertension    Thyroid  disease     Surgical History: Past Surgical History:  Procedure Laterality Date   ABDOMINAL HYSTERECTOMY     AUGMENTATION MAMMAPLASTY     CHOLECYSTECTOMY     DILATION AND CURETTAGE OF UTERUS      Home Medications:    Allergies:  Allergies  Allergen Reactions   Doxycycline Anaphylaxis    Family History: History reviewed. No pertinent family history.  Social History:  reports that she has been smoking cigarettes. She has never used smokeless tobacco. She reports current alcohol use. She reports that she does not use drugs.  ROS: Negative aside from those stated in the HPI.  Physical Exam: BP (!) 154/81 (BP Location: Left Arm)   Pulse 77   Temp 98.1 F (36.7 C) (Oral)   Resp 18   Ht 5' 6 (1.676 m)   Wt 70.3 kg   SpO2 100%   BMI 25.02 kg/m    Constitutional:  Alert and oriented, No acute distress. Cardiovascular: No clubbing, cyanosis, or edema. Respiratory: Normal respiratory effort, no increased work of breathing. GI: Abdomen is soft, nontender, nondistended, no abdominal masses Lymph: No cervical or inguinal lymphadenopathy. Skin: No rashes, bruises or suspicious lesions. Neurologic: Grossly intact, no focal deficits, moving all 4 extremities. Psychiatric: Normal mood and affect.  Laboratory Data: UA  (10/16/2023)-negative   Latest Reference Range & Units 10/16/23 11:05  Creatinine 0.44 - 1.00 mg/dL 9.09   Pertinent Imaging: I have personally reviewed the CT Stone 10/16/23 -8 mm left distal ureteral stone with proximal hydroureteronephrosis.  No renal stones noted bilaterally.  Normal-appearing bladder  Assessment & Plan:   8 mm obstructive left distal ureteral stone, intractable pain  I had a thoughtful conversation with the patient and her husband this afternoon.  There may exist a little probability of spontaneous passage although considering her pain and the size of the stone, would not have a low threshold to posterior for definitive surgery tomorrow. I explained the ureteroscopy procedure in detail, risk benefits and possible complications.  All questions answered patient agreed to this plan  Recommendations: - Medical observation overnight, pain control, aggressive hydration - Lower likelihood of spontaneous passage but okay to administer Flomax , provide urine strainer - Plan for OR tomorrow afternoon: Left ureteroscopy with laser lithotripsy, possible stent placement  [name], MD   [facility] [address], [address] [place], [place] [address] [phone]

## 2023-10-16 NOTE — ED Provider Notes (Signed)
 [facility] Provider Note    Event Date/Time   First MD Initiated Contact with Patient 10/16/23 1128     (approximate)   History   Left flank pain   HPI  TREACY HOLCOMB is a 53 y.o. female who presents to the emergency department today because of concerns for left flank pain.  Pain has been present on and off for about 1 month.  Has seen her primary care doctor as well as urology for this pain.  Was treated for UTIs.  Comes in today because pain got more severe.  Located in the left groin although does radiate up to her left flank.  Patient has noticed some blood in her urine but denies any painful urination or bad odor to urine.  Denies any fevers or chills.     Physical Exam   Triage Vital Signs: ED Triage Vitals  Encounter Vitals Group     BP 10/16/23 1100 (!) 154/81     Girls Systolic BP Percentile --      Girls Diastolic BP Percentile --      Boys Systolic BP Percentile --      Boys Diastolic BP Percentile --      Pulse Rate 10/16/23 1100 77     Resp 10/16/23 1100 18     Temp 10/16/23 1100 98.2 F (36.8 C)     Temp Source 10/16/23 1100 Oral     SpO2 10/16/23 1100 100 %     Weight 10/16/23 1101 155 lb (70.3 kg)     Height 10/16/23 1101 5' 6 (1.676 m)     Head Circumference --      Peak Flow --      Pain Score 10/16/23 1100 8     Pain Loc --      Pain Education --      Exclude from Growth Chart --     Most recent vital signs: Vitals:   10/16/23 1100 10/16/23 1132  BP: (!) 154/81   Pulse: 77   Resp: 18   Temp: 98.2 F (36.8 C)   SpO2: 100% 100%   General: Awake, alert, oriented. CV:  Good peripheral perfusion. Regular rate and rhythm. Resp:  Normal effort. Lungs clear. Abd:  No distention. Tender to palpation in the left lower quadrant.   ED Results / Procedures / Treatments   Labs (all labs ordered are listed, but only abnormal results are displayed) Labs Reviewed  CBC WITH DIFFERENTIAL/PLATELET - Abnormal; Notable for the  following components:      Result Value   Abs Immature Granulocytes 0.09 (*)    All other components within normal limits  URINALYSIS, ROUTINE W REFLEX MICROSCOPIC - Abnormal; Notable for the following components:   Color, Urine COLORLESS (*)    APPearance CLEAR (*)    Specific Gravity, Urine 1.000 (*)    Hgb urine dipstick MODERATE (*)    All other components within normal limits  COMPREHENSIVE METABOLIC PANEL WITH GFR     EKG  None   RADIOLOGY I independently interpreted and visualized the ct renal. My interpretation: left side hydroureter/hydronephrosis with multiple ureteral stones Radiology interpretation:  IMPRESSION:  Moderate left hydroureteronephrosis is noted secondary to 8 mm  calculus in distal left ureter.      PROCEDURES:  Critical Care performed: No    MEDICATIONS ORDERED IN ED: Medications - No data to display   IMPRESSION / MDM / ASSESSMENT AND PLAN / ED COURSE  I reviewed the triage  vital signs and the nursing notes.                              Differential diagnosis includes, but is not limited to, UTI, kidney stone, diverticulitis  Patient's presentation is most consistent with acute presentation with potential threat to life or bodily function.  Patient presented to the emergency department today because of concerns for left lower quadrant abdominal pain.  On exam she is tender in the left lower around.  CT ordered from triage does appear to show left hydroureter and hydronephrosis secondary to multiple left ureteral stones.  I do think kidney stones would explain the patient's symptoms.  Will give fluids and Toradol . Afebrile, no leukocytosis in blood. Will check urine for signs of infection.  Urine without signs of infection.  Radiology agreed with left ureteral stones largest measuring 8 mm.  Discussed with Dr. Georganne with urology who would like patient to be admitted for procedure tomorrow.  Will discuss with hospitalist service.      FINAL CLINICAL IMPRESSION(S) / ED DIAGNOSES   Final diagnoses:  Kidney stone      Note:  This document was prepared using Dragon voice recognition software and may include unintentional dictation errors.    Floy Roberts, MD 10/16/23 [phone]

## 2023-10-16 NOTE — ED Triage Notes (Signed)
 Patient c/o left flank pain that started yesterday and has progressively gotten worse. Reports some nausea  History same

## 2023-10-16 NOTE — Consult Note (Signed)
     Urology Consult   I have been asked to see the patient by Dr. Floy, for evaluation and management of obstructing ureteral stone.  Chief Complaint: acute left flank pain  HPI:  Colleen Phillips is a 53 y.o. year old female, presented to the ED 10/16/2023 Recently in my clinic last week, history of intermittent left flank pain-I had recommended CT scan but her pain resolved and she deferred imaging Acute left flank pain returned today, quite severe, refractory to tramadol  CT stone-8 mm left distal ureteral stone with proximal hydroureteronephrosis    PMH: Past Medical History:  Diagnosis Date   Anxiety    Hypertension    Thyroid  disease     Surgical History: Past Surgical History:  Procedure Laterality Date   ABDOMINAL HYSTERECTOMY     AUGMENTATION MAMMAPLASTY     CHOLECYSTECTOMY     DILATION AND CURETTAGE OF UTERUS      Home Medications:    Allergies:  Allergies  Allergen Reactions   Doxycycline Anaphylaxis    Family History: History reviewed. No pertinent family history.  Social History:  reports that she has been smoking cigarettes. She has never used smokeless tobacco. She reports current alcohol use. She reports that she does not use drugs.  ROS: Negative aside from those stated in the HPI.  Physical Exam: BP (!) 154/81 (BP Location: Left Arm)   Pulse 77   Temp 98.1 F (36.7 C) (Oral)   Resp 18   Ht 5' 6 (1.676 m)   Wt 70.3 kg   SpO2 100%   BMI 25.02 kg/m    Constitutional:  Alert and oriented, No acute distress. Cardiovascular: No clubbing, cyanosis, or edema. Respiratory: Normal respiratory effort, no increased work of breathing. GI: Abdomen is soft, nontender, nondistended, no abdominal masses Lymph: No cervical or inguinal lymphadenopathy. Skin: No rashes, bruises or suspicious lesions. Neurologic: Grossly intact, no focal deficits, moving all 4 extremities. Psychiatric: Normal mood and affect.  Laboratory Data: UA  (10/16/2023)-negative   Latest Reference Range & Units 10/16/23 11:05  Creatinine 0.44 - 1.00 mg/dL 9.09   Pertinent Imaging: I have personally reviewed the CT Stone 10/16/23 -8 mm left distal ureteral stone with proximal hydroureteronephrosis.  No renal stones noted bilaterally.  Normal-appearing bladder  Assessment & Plan:   8 mm obstructive left distal ureteral stone, intractable pain  I had a thoughtful conversation with the patient and her husband this afternoon.  There may exist a little probability of spontaneous passage although considering her pain and the size of the stone, would not have a low threshold to posterior for definitive surgery tomorrow. I explained the ureteroscopy procedure in detail, risk benefits and possible complications.  All questions answered patient agreed to this plan  Recommendations: - Medical observation overnight, pain control, aggressive hydration - Lower likelihood of spontaneous passage but okay to administer Flomax , provide urine strainer - Plan for OR tomorrow afternoon: Left ureteroscopy with laser lithotripsy, possible stent placement  [name], MD   [facility] [address], [address] [place], [place] [address] [phone]

## 2023-10-17 ENCOUNTER — Encounter
Admission: EM | Disposition: A | Discharge status: Home / Self Care | Payer: Self-pay | Source: Ambulatory Visit | Attending: Emergency Medicine

## 2023-10-17 ENCOUNTER — Observation Stay

## 2023-10-17 ENCOUNTER — Encounter: Payer: Self-pay | Admitting: Internal Medicine

## 2023-10-17 ENCOUNTER — Observation Stay: Admitting: Certified Registered Nurse Anesthetist

## 2023-10-17 ENCOUNTER — Other Ambulatory Visit: Payer: Self-pay

## 2023-10-17 DIAGNOSIS — N139 Obstructive and reflux uropathy, unspecified: Secondary | ICD-10-CM | POA: Diagnosis not present

## 2023-10-17 DIAGNOSIS — N201 Calculus of ureter: Secondary | ICD-10-CM | POA: Diagnosis not present

## 2023-10-17 HISTORY — PX: CYSTOSCOPY/URETEROSCOPY/HOLMIUM LASER/STENT PLACEMENT: SHX6546

## 2023-10-17 HISTORY — PX: CYSTOSCOPY W/ RETROGRADES: SHX1426

## 2023-10-17 LAB — HIV ANTIBODY (ROUTINE TESTING W REFLEX): HIV Screen 4th Generation wRfx: NONREACTIVE

## 2023-10-17 SURGERY — CYSTOSCOPY/URETEROSCOPY/HOLMIUM LASER/STENT PLACEMENT
Anesthesia: General | Laterality: Left

## 2023-10-17 MED ORDER — DROPERIDOL 2.5 MG/ML IJ SOLN
0.6250 mg | Freq: Once | INTRAMUSCULAR | Status: AC | PRN
  Administered 2023-10-17: 0.625 mg via INTRAVENOUS

## 2023-10-17 MED ORDER — DEXAMETHASONE SODIUM PHOSPHATE 10 MG/ML IJ SOLN
INTRAMUSCULAR | Status: AC
  Filled 2023-10-17: qty 1

## 2023-10-17 MED ORDER — LACTATED RINGERS IV SOLN: INTRAVENOUS | Status: DC | PRN

## 2023-10-17 MED ORDER — TAMSULOSIN HCL 0.4 MG PO CAPS
0.4000 mg | ORAL_CAPSULE | Freq: Every day | ORAL | 0 refills | Status: AC
  Filled 2023-10-17: qty 7, 7d supply, fill #0

## 2023-10-17 MED ORDER — MORPHINE SULFATE (PF) 2 MG/ML IV SOLN: 2.0000 mg | INTRAVENOUS | Status: DC | PRN

## 2023-10-17 MED ORDER — FENTANYL CITRATE (PF) 100 MCG/2ML IJ SOLN
INTRAMUSCULAR | Status: DC | PRN
  Administered 2023-10-17: 50 ug via INTRAVENOUS

## 2023-10-17 MED ORDER — SODIUM CHLORIDE 0.9 % IR SOLN
Status: DC | PRN
  Administered 2023-10-17: 1

## 2023-10-17 MED ORDER — FENTANYL CITRATE (PF) 100 MCG/2ML IJ SOLN
INTRAMUSCULAR | Status: AC
  Filled 2023-10-17: qty 2

## 2023-10-17 MED ORDER — OXYBUTYNIN CHLORIDE ER 5 MG PO TB24
5.0000 mg | ORAL_TABLET | Freq: Every day | ORAL | 0 refills | Status: AC
  Filled 2023-10-17: qty 7, 7d supply, fill #0

## 2023-10-17 MED ORDER — CEFAZOLIN SODIUM-DEXTROSE 2-4 GM/100ML-% IV SOLN
INTRAVENOUS | Status: AC
  Filled 2023-10-17: qty 100

## 2023-10-17 MED ORDER — MIDAZOLAM HCL 2 MG/2ML IJ SOLN
INTRAMUSCULAR | Status: DC | PRN
  Administered 2023-10-17: 2 mg via INTRAVENOUS

## 2023-10-17 MED ORDER — PROPOFOL 10 MG/ML IV BOLUS
INTRAVENOUS | Status: DC | PRN
  Administered 2023-10-17: 100 mg via INTRAVENOUS
  Administered 2023-10-17: 150 mg via INTRAVENOUS

## 2023-10-17 MED ORDER — PHENAZOPYRIDINE HCL 100 MG PO TABS
200.0000 mg | ORAL_TABLET | Freq: Three times a day (TID) | ORAL | 0 refills | Status: AC | PRN
Start: 2023-10-17 — End: 2023-10-22
  Filled 2023-10-17: qty 30, 5d supply, fill #0

## 2023-10-17 MED ORDER — CEFAZOLIN SODIUM-DEXTROSE 2-3 GM-%(50ML) IV SOLR
INTRAVENOUS | Status: DC | PRN
  Administered 2023-10-17: 2 g via INTRAVENOUS

## 2023-10-17 MED ORDER — FENTANYL CITRATE (PF) 100 MCG/2ML IJ SOLN: 25.0000 ug | INTRAMUSCULAR | Status: DC | PRN

## 2023-10-17 MED ORDER — ACETAMINOPHEN 10 MG/ML IV SOLN
INTRAVENOUS | Status: DC | PRN
  Administered 2023-10-17: 1000 mg via INTRAVENOUS

## 2023-10-17 MED ORDER — PROPOFOL 1000 MG/100ML IV EMUL
INTRAVENOUS | Status: AC
  Filled 2023-10-17: qty 100

## 2023-10-17 MED ORDER — KETOROLAC TROMETHAMINE 15 MG/ML IJ SOLN: 15.0000 mg | Freq: Four times a day (QID) | INTRAMUSCULAR | Status: DC

## 2023-10-17 MED ORDER — LIDOCAINE HCL (CARDIAC) PF 100 MG/5ML IV SOSY
PREFILLED_SYRINGE | INTRAVENOUS | Status: DC | PRN
  Administered 2023-10-17: 60 mg via INTRAVENOUS

## 2023-10-17 MED ORDER — DROPERIDOL 2.5 MG/ML IJ SOLN
INTRAMUSCULAR | Status: AC
  Filled 2023-10-17: qty 2

## 2023-10-17 MED ORDER — CIPROFLOXACIN HCL 500 MG PO TABS
ORAL_TABLET | ORAL | 0 refills | Status: DC
Start: 2023-10-17 — End: 2023-10-24
  Filled 2023-10-17: qty 1, 1d supply, fill #0

## 2023-10-17 MED ORDER — DEXAMETHASONE SODIUM PHOSPHATE 10 MG/ML IJ SOLN
INTRAMUSCULAR | Status: DC | PRN
  Administered 2023-10-17: 5 mg via INTRAVENOUS
  Administered 2023-10-17: 10 mg via INTRAVENOUS
  Administered 2023-10-17: 5 mg via INTRAVENOUS
  Administered 2023-10-17: 10 mg via INTRAVENOUS
  Administered 2023-10-17: 5 mg via INTRAVENOUS

## 2023-10-17 MED ORDER — PROPOFOL 10 MG/ML IV BOLUS
INTRAVENOUS | Status: AC
  Filled 2023-10-17: qty 20

## 2023-10-17 MED ORDER — ONDANSETRON HCL 4 MG/2ML IJ SOLN
INTRAMUSCULAR | Status: AC
  Filled 2023-10-17: qty 2

## 2023-10-17 MED ORDER — SUCCINYLCHOLINE CHLORIDE 200 MG/10ML IV SOSY
PREFILLED_SYRINGE | INTRAVENOUS | Status: DC | PRN
  Administered 2023-10-17: 80 mg via INTRAVENOUS

## 2023-10-17 MED ORDER — TRAMADOL HCL 50 MG PO TABS
50.0000 mg | ORAL_TABLET | Freq: Four times a day (QID) | ORAL | 0 refills | Status: AC | PRN
  Filled 2023-10-17: qty 12, 3d supply, fill #0

## 2023-10-17 MED ORDER — ACETAMINOPHEN 10 MG/ML IV SOLN
INTRAVENOUS | Status: AC
  Filled 2023-10-17: qty 100

## 2023-10-17 MED ORDER — IOHEXOL 180 MG/ML  SOLN
INTRAMUSCULAR | Status: DC | PRN
  Administered 2023-10-17 (×2): 10 mL

## 2023-10-17 MED ORDER — MIDAZOLAM HCL 2 MG/2ML IJ SOLN
INTRAMUSCULAR | Status: AC
  Filled 2023-10-17: qty 2

## 2023-10-17 MED ORDER — TRAZODONE HCL 50 MG PO TABS
25.0000 mg | ORAL_TABLET | Freq: Every evening | ORAL | Status: DC | PRN
  Filled 2023-10-17: qty 1

## 2023-10-17 SURGICAL SUPPLY — 20 items
BAG DRAIN SIEMENS DORNER NS (MISCELLANEOUS) ×3 IMPLANT
BASKET ZERO TIP 1.9FR (BASKET) IMPLANT
BRUSH SCRUB EZ 4% CHG (MISCELLANEOUS) ×3 IMPLANT
CATH URET FLEX-TIP 2 LUMEN 10F (CATHETERS) IMPLANT
CATH URETL OPEN 5X70 (CATHETERS) IMPLANT
CNTNR URN SCR LID CUP LEK RST (MISCELLANEOUS) IMPLANT
DRAPE UTILITY 15X26 TOWEL STRL (DRAPES) ×3 IMPLANT
FIBER LASER MOSES 200 DFL (Laser) IMPLANT
GLOVE BIOGEL PI IND STRL 7.0 (GLOVE) ×3 IMPLANT
GOWN STRL REUS W/ TWL LRG LVL3 (GOWN DISPOSABLE) ×6 IMPLANT
GUIDEWIRE ANG ZIPWIRE 035X150 (WIRE) IMPLANT
GUIDEWIRE STR DUAL SENSOR (WIRE) ×3 IMPLANT
KIT TURNOVER CYSTO (KITS) ×3 IMPLANT
PACK CYSTO AR (MISCELLANEOUS) ×3 IMPLANT
SET CYSTO W/LG BORE CLAMP LF (SET/KITS/TRAYS/PACK) ×3 IMPLANT
SOL .9 NS 3000ML IRR UROMATIC (IV SOLUTION) ×3 IMPLANT
STENT URET 6FRX26 CONTOUR (STENTS) IMPLANT
SURGILUBE 2OZ TUBE FLIPTOP (MISCELLANEOUS) ×3 IMPLANT
SYR 10ML LL (SYRINGE) ×3 IMPLANT
WATER STERILE IRR 500ML POUR (IV SOLUTION) ×3 IMPLANT

## 2023-10-17 NOTE — Discharge Summary (Signed)
 Physician Discharge Summary  Colleen Phillips FMW:[id number] DOB: 02-Oct-1970 DOA: 10/16/2023  PCP: Cyrus Selinda Moose, PA-C  Admit date: 10/16/2023 Discharge date: 10/17/2023  Admitted From: Home Disposition:  Home  Recommendations for Outpatient Follow-up:  Follow up with PCP in 1-2 weeks Follow up urology 6 weeks with [facility] prior to visit  Home Health: No  Equipment/Devices:None   Discharge Condition:Stable  CODE STATUS:FULL  Diet recommendation: Reg  Brief/Interim Summary:  53 y.o. female with medical history significant of rheumatoid arthritis, HTN, migraines, hypothyroidism, presented with worsening of left flank pain.   Patient started to have sharp like left flank pain radiating to groin area about 3 to 4 weeks ago, intermittent, she had dysuria already states she developed cold symptoms and was treated for UTI.  This morning, patient woke up with persistent left flank pain radiating to left groin area without any dysuria.  No fever or chills.    Discharge Diagnoses:  Principal Problem:   Obstructive uropathy  Left-sided obstructive uropathy Left distal ureteral stone Status post cystoscopy with left ureteroscopy with laser lithotripsy and left retrograde pyelography and left ureteral stent placement.  Ureteral stent temporary and left in place with string.  Patient is instructed to remove ureteral stent at home on 9/24.  Take 1 dose of ciprofloxacin  500 mg p.o. prior to pulling ureteral stent.  Follow-up in 6 weeks with [facility] prior to visit   Migraines - Stable   Hypothyroidism - Continue home medication   Alopecia - On spironolactone    Discharge Instructions   Allergies as of 10/17/2023       Reactions   Doxycycline Anaphylaxis        Medication List     TAKE these medications    ciprofloxacin  500 MG tablet Commonly known as: Cipro  Take 1 tablet by mouth about 1 hour prior to your ureteral stent removal   dicyclomine  10 MG capsule Commonly known  as: BENTYL  Take 1 capsule (10 mg total) by mouth 4 (four) times daily -  before meals and at bedtime.   fluticasone  50 MCG/ACT nasal spray Commonly known as: FLONASE  Place 2 sprays into both nostrils daily.   folic acid  1 MG tablet Commonly known as: FOLVITE  Take 1 tablet (1 mg total) by mouth once daily   furosemide  20 MG tablet Commonly known as: LASIX  Take 1 tablet (20 mg total) by mouth once daily as needed for Edema   levothyroxine  125 MCG tablet Commonly known as: SYNTHROID  Take 125 mcg by mouth daily at 6 (six) AM.   lidocaine -prilocaine  cream Commonly known as: EMLA  Apply topically as needed as directed.   methotrexate  2.5 MG tablet Commonly known as: RHEUMATREX Take 6 tablets (15 mg total) by mouth every 7 (seven) days   metoprolol  succinate 25 MG 24 hr tablet Commonly known as: TOPROL -XL TAKE ONE TABLET BY MOUTH ONCE DAILY   nitroGLYCERIN  0.4 MG SL tablet Commonly known as: NITROSTAT  Place under the tongue.   ondansetron  4 MG disintegrating tablet Commonly known as: ZOFRAN -ODT Take 1 tablet (4 mg total) by mouth every 8 (eight) hours as needed for nausea for up to 7 days.   oxybutynin  5 MG 24 hr tablet Commonly known as: Ditropan  XL Take 1 tablet (5 mg total) by mouth daily for 7 days.   pantoprazole  40 MG tablet Commonly known as: PROTONIX  Take 1 tablet (40 mg total) by mouth daily.   phenazopyridine  100 MG tablet Commonly known as: Pyridium  Take 2 tablets (200 mg total) by mouth 3 (three)  times daily as needed for up to 5 days for pain.   propranolol  10 MG tablet Commonly known as: INDERAL  Take 1 tablet (10 mg total) by mouth 2 (two) times daily   sertraline  50 MG tablet Commonly known as: ZOLOFT  Take 3 tablets (150 mg total) by mouth daily.   spironolactone  25 MG tablet Commonly known as: ALDACTONE  Take 1 tablet (25 mg total) by mouth daily.   SUMAtriptan  25 MG tablet Commonly known as: IMITREX  Take 1 tablet (25 mg total) by mouth once as  needed for migraine for up to 1 dose. May take a second dose after 2 hours if needed.   tacrolimus 0.1 % ointment Commonly known as: PROTOPIC Apply 1 Application topically as needed.   tamsulosin  0.4 MG Caps capsule Commonly known as: FLOMAX  Take 1 capsule (0.4 mg total) by mouth daily for 7 days.   traMADol  50 MG tablet Commonly known as: Ultram  Take 1 tablet (50 mg total) by mouth every 6 (six) hours as needed for up to 3 days.   valACYclovir  500 MG tablet Commonly known as: VALTREX  Take 1 tablet (500 mg total) by mouth 2 (two) times daily for 3 days   Vtama  1 % Crea Generic drug: Tapinarof  Apply 1 application topically daily.   Wegovy 0.5 MG/0.5ML Soaj SQ injection Generic drug: semaglutide-weight management Inject 0.5 mg into the skin once a week.        Follow-up Information     Cyrus Mayo Hestle, PA-C Follow up.   Specialty: Family Medicine Why: hospital follow up Contact information: [address] [place] [address] [phone]                Allergies  Allergen Reactions   Doxycycline Anaphylaxis    Consultations: Urology   Procedures/Studies: DG OR UROLOGY CYSTO IMAGE ([facility] ONLY) Result Date: 10/17/2023 There is no interpretation for this exam.  This order is for images obtained during a surgical procedure.  Please See Surgeries Tab for more information regarding the procedure.   CT CARDIAC SCORING (SELF PAY ONLY) Addendum Date: 10/17/2023 ADDENDUM REPORT: 10/17/2023 08:58 EXAM: OVER-READ INTERPRETATION  CT CHEST The following report is an over-read performed by radiologist Dr. Fonda Mom [facility] on 10/17/2023. This over-read does not include interpretation of cardiac or coronary anatomy or pathology. The coronary calcium score interpretation by the cardiologist is attached. COMPARISON:  None. FINDINGS: Cardiovascular:  See findings discussed in the body of the report. Mediastinum/Nodes: No suspicious  adenopathy identified. Imaged mediastinal structures are unremarkable. Lungs/Pleura: Imaged lungs are clear. No pleural effusion or pneumothorax. Upper Abdomen: No acute abnormality. Musculoskeletal: No chest wall abnormality. No acute osseous findings. IMPRESSION: No acute extracardiac incidental findings. Electronically Signed   By: Fonda Field M.D.   On: 10/17/2023 08:58   Result Date: 10/17/2023 : CLINICAL DATA: Risk stratification EXAM: Coronary Calcium Score TECHNIQUE: The patient was scanned on a Siemens Somatom scanner. Axial non-contrast 3 mm slices were carried out through the heart. The data set was analyzed on a dedicated work station and scored using the Agatston method. FINDINGS: Ascending Aorta: Normal size Pericardium: Normal Coronary arteries: Distribution of arterial calcifications if present, as noted below; Left main: 0 Left anterior descending artery: 0 Left circumflex artery: 0 Right coronary artery: 0 Total: 0 IMPRESSION AND RECOMMENDATION: 1. Coronary calcium score of 0. 2. CAC 0, CAC-DRS A0. 3. Continue heart healthy lifestyle and risk factor modification. 4. See separate report from [facility] radiology for non-cardiac findings. Electronically Signed: By:  Keller Paterson M.D. On: 10/13/2023 12:39   CT Renal Stone Study Result Date: 10/16/2023 CLINICAL DATA:  Left flank pain. EXAM: CT ABDOMEN AND PELVIS WITHOUT CONTRAST TECHNIQUE: Multidetector CT imaging of the abdomen and pelvis was performed following the standard protocol without IV contrast. RADIATION DOSE REDUCTION: This exam was performed according to the departmental dose-optimization program which includes automated exposure control, adjustment of the mA and/or kV according to patient size and/or use of iterative reconstruction technique. COMPARISON:  August 18, 2017. FINDINGS: Lower chest: No acute abnormality. Hepatobiliary: No focal liver abnormality is seen. Status post cholecystectomy. No biliary dilatation. Pancreas:  Unremarkable. No pancreatic ductal dilatation or surrounding inflammatory changes. Spleen: Normal in size without focal abnormality. Adrenals/Urinary Tract: Adrenal glands appear normal. Moderate left hydroureteronephrosis is noted secondary to 8 mm calculus in distal right ureter. Urinary bladder is unremarkable. Stomach/Bowel: The stomach is unremarkable. There is no evidence of bowel obstruction or inflammation. The appendix is not clearly visualized. Vascular/Lymphatic: No significant vascular findings are present. No enlarged abdominal or pelvic lymph nodes. Reproductive: Status post hysterectomy. No adnexal masses. Other: No abdominal wall hernia or abnormality. No abdominopelvic ascites. Musculoskeletal: No acute or significant osseous findings. IMPRESSION: Moderate left hydroureteronephrosis is noted secondary to 8 mm calculus in distal left ureter. Electronically Signed   By: Lynwood Landy Raddle M.D.   On: 10/16/2023 11:59      Subjective: Seen and examined on the day of discharge.  Stable no distress.  Appropriate for discharge home.  Discharge Exam: Vitals:   10/17/23 1320 10/17/23 1453  BP: 114/67 119/75  Pulse: 74 71  Resp: 20 20  Temp: 98.2 F (36.8 C)   SpO2: 99% 95%   Vitals:   10/17/23 1300 10/17/23 1315 10/17/23 1320 10/17/23 1453  BP: 131/72 (!) 133/59 114/67 119/75  Pulse: 84 74 74 71  Resp: 12 14 20 20   Temp:  98.1 F (36.7 C) 98.2 F (36.8 C)   TempSrc:      SpO2: 100% 100% 99% 95%  Weight:      Height:        General: Pt is alert, awake, not in acute distress Cardiovascular: RRR, S1/S2 +, no rubs, no gallops Respiratory: CTA bilaterally, no wheezing, no rhonchi Abdominal: Soft, NT, ND, bowel sounds + Extremities: no edema, no cyanosis    The results of significant diagnostics from this hospitalization (including imaging, microbiology, ancillary and laboratory) are listed below for reference.     Microbiology: No results found for this or any previous  visit (from the past 240 hours).   Labs: BNP (last 3 results) No results for input(s): BNP in the last 8760 hours. Basic Metabolic Panel: Recent Labs  Lab 10/16/23 1105  NA 137  K 3.7  CL 103  CO2 26  GLUCOSE 98  BUN 19  CREATININE 0.90  CALCIUM 9.2   Liver Function Tests: Recent Labs  Lab 10/16/23 1105  AST 17  ALT 18  ALKPHOS 54  BILITOT 0.7  PROT 7.4  ALBUMIN 4.3   No results for input(s): LIPASE, AMYLASE in the last 168 hours. No results for input(s): AMMONIA in the last 168 hours. CBC: Recent Labs  Lab 10/16/23 1105  WBC 7.8  NEUTROABS 5.0  HGB 14.4  HCT 43.2  MCV 88.9  PLT 200   Cardiac Enzymes: No results for input(s): CKTOTAL, CKMB, CKMBINDEX, TROPONINI in the last 168 hours. BNP: Invalid input(s): POCBNP CBG: No results for input(s): GLUCAP in the last 168 hours. D-Dimer  No results for input(s): DDIMER in the last 72 hours. Hgb A1c No results for input(s): HGBA1C in the last 72 hours. Lipid Profile No results for input(s): CHOL, HDL, LDLCALC, TRIG, CHOLHDL, LDLDIRECT in the last 72 hours. Thyroid  function studies No results for input(s): TSH, T4TOTAL, T3FREE, THYROIDAB in the last 72 hours.  Invalid input(s): FREET3 Anemia work up No results for input(s): VITAMINB12, FOLATE, FERRITIN, TIBC, IRON, RETICCTPCT in the last 72 hours. Urinalysis    Component Value Date/Time   COLORURINE COLORLESS (A) 10/16/2023 1239   APPEARANCEUR CLEAR (A) 10/16/2023 1239   APPEARANCEUR Slightly cloudy 10/06/2023 0823   LABSPEC 1.000 (L) 10/16/2023 1239   LABSPEC 1.013 05/12/2014 2014   PHURINE 7.0 10/16/2023 1239   GLUCOSEU NEGATIVE 10/16/2023 1239   GLUCOSEU Negative 05/12/2014 2014   HGBUR MODERATE (A) 10/16/2023 1239   BILIRUBINUR NEGATIVE 10/16/2023 1239   BILIRUBINUR Negative 10/06/2023 0823   BILIRUBINUR Negative 05/12/2014 2014   KETONESUR NEGATIVE 10/16/2023 1239   PROTEINUR NEGATIVE  10/16/2023 1239   NITRITE NEGATIVE 10/16/2023 1239   LEUKOCYTESUR NEGATIVE 10/16/2023 1239   LEUKOCYTESUR Negative 05/12/2014 2014   Sepsis Labs Recent Labs  Lab 10/16/23 1105  WBC 7.8   Microbiology No results found for this or any previous visit (from the past 240 hours).   Time coordinating discharge: 40 minutes  SIGNED:   Calvin KATHEE Robson, MD  [facility] 10/17/2023, 3:04 PM Pager   If 7PM-7AM, please contact night-coverage

## 2023-10-17 NOTE — Op Note (Signed)
 Preoperative diagnosis:  Left ureteral stone   Postoperative diagnosis:  Left ureteral stone  Procedure:  Cystoscopy left ureteroscopy with laser lithotripsy left retrograde pyelography with interpretation  left ureteral stent placement  Surgeon: Penne Skye, MD  Anesthesia: General  Complications: None  Intraoperative findings: Distal left ureteral stone with significant impaction / periureteral inflammation. Fully treated with laser lithotripsy, no stones remaining. Temporary 5-day stent with string placed.   EBL: Minimal  Specimens: Left proximal hydronephrotic drip for urine culture  Indication: Colleen Phillips is a 53 y.o. patient with an 8 mm obstructing left distal ureteral stone with proximal hydroureteronephrosis, intractable pain. After reviewing the management options for treatment, he elected to proceed with the above surgical procedure(s). We have discussed the potential benefits and risks of the procedure, side effects of the proposed treatment, the likelihood of the patient achieving the goals of the procedure, and any potential problems that might occur during the procedure or recuperation. Informed consent has been obtained.  Description of procedure:  The patient was taken to the operating room and general anesthesia was induced.  The patient was placed in the dorsal lithotomy position, prepped and draped in the usual sterile fashion, and preoperative antibiotics were administered. A preoperative time-out was performed.   Cystourethroscopy was performed.  The patient's urethra was examined and was normal. The bladder was then systematically examined in its entirety. There was no evidence for any bladder tumors, stones, or other mucosal pathology.  Bilateral ureteral orifices noted in orthotopic location.  Attention then turned to the leftureteral orifice and a ureteral catheter was used to intubate the ureteral orifice.  Omnipaque  contrast was injected through the  ureteral catheter and a retrograde pyelogram was performed with findings: normal left ureteral contours, mild hydroureteronephrosis, filling defect at left distal ureter  A sensor guidewire was then advanced up the left ureter into the renal pelvis under fluoroscopic guidance. The UO was tight and navigation around the impacted stone was challenging. We elected to place a dual lumen catheter and a 2nd guidewire for safety and ease of ureteroscope access. We transitioned to a semi rigid ureteroscope and encountered a significantly impacted stone within the Left distal ureter. There was a moderate degree of ureteral mucosal inflammation.  A 200 m holmium laser was used to fully treat the stone with laser lithotripsy. A zero-tip basket was then used to extract all residual fragments. Repeat ureteroscopy to proximal ureter did not reveal any further stones. A final distal RPG was obtained which did not reveal any contrast extravasation at the treatment site.   Considering the degree of impaction and ureteral inflammation, we elected to place a temporary ureteral stent on a string. Over a working wire, a 28F x 24cm JJ ureteral stent with string was placed under fluoroscopic guidance. A spot KUB confirmed adequate proximal and distal stent curls.   The bladder was then emptied and the procedure ended.  The patient appeared to tolerate the procedure well and without complications.  The patient was able to be awakened and transferred to the recovery unit in satisfactory condition.    Plan: - remove ureteral stent at home on 10/22/23 - take 1 dose of pre-pull Cipro  500 Po - follow up in ~6 weeks with a RBUS prior to visit  Penne Skye, M.D.

## 2023-10-17 NOTE — Discharge Instructions (Signed)
 DISCHARGE INSTRUCTIONS FOR KIDNEY STONE/URETERAL STENT   MEDICATIONS:  1.  Resume all your other meds from home - except do not take any extra narcotic pain meds that you may have at home.  2. Pyridium  is to help with the burning/stinging when you urinate. 3. Tramadol  is for moderate/severe pain, otherwise taking upto 1000 mg every 6 hours of plainTylenol will help treat your pain.   4. Take Cipro  one hour prior to removal of your stent.   ACTIVITY:  1. No strenuous activity x 1week  2. No driving while on narcotic pain medications  3. Drink plenty of water  4. Continue to walk at home - you can still get blood clots when you are at home, so keep active, but don't over do it.  5. May return to work/school tomorrow or when you feel ready   BATHING:  1. You can shower and we recommend daily showers  2. You have a string coming from your urethra: The stent string is attached to your ureteral stent. Do not pull on this.   SIGNS/SYMPTOMS TO CALL:  Please call us  if you have a fever greater than 101.5, uncontrolled nausea/vomiting, uncontrolled pain, dizziness, unable to urinate, bloody urine, chest pain, shortness of breath, leg swelling, leg pain, redness around wound, drainage from wound, or any other concerns or questions.   You can reach us  at [phone].   FOLLOW-UP:  1. You will have an appointment in 6 weeks with a ultrasound of your kidneys prior.   2. You have a string attached to your stent, you may remove it on 10/22/23. To do this, pull the strings until the stents are completely removed. You may feel an odd sensation in your back.

## 2023-10-17 NOTE — Plan of Care (Signed)

## 2023-10-17 NOTE — Anesthesia Preprocedure Evaluation (Signed)
 Anesthesia Evaluation  Patient identified by MRN, date of birth, ID band Patient awake    Reviewed: Allergy & Precautions, H&P , NPO status , Patient's Chart, lab work & pertinent test results, reviewed documented beta blocker date and time   History of Anesthesia Complications Negative for: history of anesthetic complications  Airway Mallampati: I  TM Distance: >3 FB Neck ROM: full    Dental  (+) Dental Advidsory Given, Teeth Intact   Pulmonary neg shortness of breath, neg COPD, neg recent URI, Current Smoker and Patient abstained from smoking.   Pulmonary exam normal breath sounds clear to auscultation       Cardiovascular Exercise Tolerance: Good hypertension, (-) angina (-) Past MI and (-) Cardiac Stents negative cardio ROS Normal cardiovascular exam(-) dysrhythmias (-) Valvular Problems/Murmurs Rhythm:regular Rate:Normal     Neuro/Psych  PSYCHIATRIC DISORDERS Anxiety     negative neurological ROS     GI/Hepatic Neg liver ROS,GERD  ,,  Endo/Other  neg diabetesHypothyroidism    Renal/GU negative Renal ROS  negative genitourinary   Musculoskeletal   Abdominal   Peds  Hematology negative hematology ROS (+)   Anesthesia Other Findings Past Medical History: No date: Anxiety No date: Hypertension No date: Thyroid  disease   Reproductive/Obstetrics negative OB ROS                              Anesthesia Physical Anesthesia Plan  ASA: 2  Anesthesia Plan: General   Post-op Pain Management:    Induction: Intravenous, Rapid sequence and Cricoid pressure planned  PONV Risk Score and Plan: 2 and Ondansetron , Dexamethasone , Midazolam  and Treatment may vary due to age or medical condition  Airway Management Planned: Oral ETT  Additional Equipment:   Intra-op Plan:   Post-operative Plan: Extubation in OR  Informed Consent: I have reviewed the patients History and Physical, chart,  labs and discussed the procedure including the risks, benefits and alternatives for the proposed anesthesia with the patient or authorized representative who has indicated his/her understanding and acceptance.     Dental Advisory Given  Plan Discussed with: Anesthesiologist, CRNA and Surgeon  Anesthesia Plan Comments:          Anesthesia Quick Evaluation

## 2023-10-17 NOTE — Interval H&P Note (Signed)
 History and Physical Interval Note:  10/17/2023 11:39 AM  Colleen Phillips  has presented today for surgery, with the diagnosis of left ureteral stone.  The various methods of treatment have been discussed with the patient and family. After consideration of risks, benefits and other options for treatment, the patient has consented to  Procedure(s): CYSTOSCOPY/URETEROSCOPY/HOLMIUM LASER/STENT PLACEMENT (Left) as a surgical intervention.  The patient's history has been reviewed, patient examined, no change in status, stable for surgery.  I have reviewed the patient's chart and labs.  Questions were answered to the patient's satisfaction.     [name]

## 2023-10-17 NOTE — Transfer of Care (Signed)
 Immediate Anesthesia Transfer of Care Note  Patient: Colleen Phillips  Procedure(s) Performed: CYSTOSCOPY/URETEROSCOPY/HOLMIUM LASER/STENT PLACEMENT (Left)  Patient Location: PACU  Anesthesia Type:General  Level of Consciousness: awake and alert   Airway & Oxygen Therapy: Patient Spontanous Breathing and Patient connected to nasal cannula oxygen  Post-op Assessment: Report given to RN and Post -op Vital signs reviewed and stable  Post vital signs: stable  Last Vitals:  Vitals Value Taken Time  BP 112/47 10/17/23 12:57  Temp    Pulse 84 10/17/23 13:00  Resp 12 10/17/23 13:00  SpO2 100 % 10/17/23 13:00  Vitals shown include unfiled device data.  Last Pain:  Vitals:   10/17/23 1134  TempSrc: Temporal  PainSc: 0-No pain      Patients Stated Pain Goal: 5 (10/16/23 2032)  Complications: No notable events documented.

## 2023-10-17 NOTE — Anesthesia Procedure Notes (Signed)
 Procedure Name: Intubation Date/Time: 10/17/2023 11:51 AM  Performed by: Gillermo Spruce I, CRNAPre-anesthesia Checklist: Patient identified, Emergency Drugs available, Suction available and Patient being monitored Patient Re-evaluated:Patient Re-evaluated prior to induction Oxygen Delivery Method: Circle system utilized Preoxygenation: Pre-oxygenation with 100% oxygen Induction Type: IV induction and Rapid sequence Laryngoscope Size: McGrath and 3 Grade View: Grade I Tube type: Oral Tube size: 7.0 mm Number of attempts: 1 Airway Equipment and Method: Stylet and Oral airway Placement Confirmation: ETT inserted through vocal cords under direct vision, positive ETCO2 and breath sounds checked- equal and bilateral Secured at: 20 cm Tube secured with: Tape Dental Injury: Teeth and Oropharynx as per pre-operative assessment

## 2023-10-18 LAB — URINE CULTURE: Culture: NO GROWTH

## 2023-10-18 NOTE — Anesthesia Postprocedure Evaluation (Signed)
 Anesthesia Post Note  Patient: Colleen Phillips  Procedure(s) Performed: CYSTOSCOPY/URETEROSCOPY/HOLMIUM LASER/STENT PLACEMENT (Left) CYSTOSCOPY, WITH RETROGRADE PYELOGRAM  Patient location during evaluation: PACU Anesthesia Type: General Level of consciousness: awake and alert Pain management: pain level controlled Vital Signs Assessment: post-procedure vital signs reviewed and stable Respiratory status: spontaneous breathing, nonlabored ventilation, respiratory function stable and patient connected to nasal cannula oxygen Cardiovascular status: blood pressure returned to baseline and stable Postop Assessment: no apparent nausea or vomiting Anesthetic complications: no   No notable events documented.   Last Vitals:  Vitals:   10/17/23 1453 10/17/23 1610  BP: 119/75 120/79  Pulse: 71 76  Resp: 20 12  Temp:    SpO2: 95% 95%    Last Pain:  Vitals:   10/17/23 1815  TempSrc:   PainSc: Asleep                 Prentice Murphy

## 2023-10-19 ENCOUNTER — Encounter: Payer: Self-pay | Admitting: Urology

## 2023-10-20 ENCOUNTER — Other Ambulatory Visit: Payer: Self-pay

## 2023-10-20 ENCOUNTER — Telehealth: Payer: Self-pay

## 2023-10-20 ENCOUNTER — Ambulatory Visit: Admission: RE | Admit: 2023-10-20 | Source: Ambulatory Visit

## 2023-10-20 DIAGNOSIS — N201 Calculus of ureter: Secondary | ICD-10-CM

## 2023-10-20 NOTE — Telephone Encounter (Signed)
 Pt called with complaints of pain in side and bladder. No fevers or burning with urination. I was able to go over how her stent is placed in her kidney to her bladder and how this can cause some discomfort while its in. Pt was agreeable to taking aleve  with her tramadol  but instructed not to exceed the normal dose per day. Was was instructed to call us  back if things changed and pt understood

## 2023-10-21 ENCOUNTER — Other Ambulatory Visit: Payer: Self-pay

## 2023-10-21 ENCOUNTER — Telehealth: Payer: Self-pay

## 2023-10-21 MED ORDER — PANTOPRAZOLE SODIUM 40 MG PO TBEC
40.0000 mg | DELAYED_RELEASE_TABLET | Freq: Every day | ORAL | 0 refills | Status: DC
  Filled 2023-10-21: qty 90, 90d supply, fill #0

## 2023-10-21 NOTE — Telephone Encounter (Signed)
 Pt called the triage line stating her stent fell out. After further questioning, it was the string attracted to the stent. Pt will come in for a stent removal on 10/24/2023 with Dr.Garren. pt was advise to call us  if she develops any pain

## 2023-10-22 ENCOUNTER — Other Ambulatory Visit: Payer: Self-pay

## 2023-10-22 MED ORDER — SUMATRIPTAN SUCCINATE 25 MG PO TABS
25.0000 mg | ORAL_TABLET | Freq: Once | ORAL | 2 refills | Status: AC | PRN
  Filled 2023-10-22: qty 9, 15d supply, fill #0
  Filled 2023-11-04: qty 9, 15d supply, fill #1
  Filled 2023-11-26: qty 9, 15d supply, fill #2
  Filled 2023-12-10: qty 9, 15d supply, fill #3
  Filled 2024-01-02 (×3): qty 9, 15d supply, fill #4

## 2023-10-22 NOTE — Progress Notes (Unsigned)
   10/24/2023 2:23 PM   Colleen Phillips Jul 09, 1970 [id number]  Cystoscopy with ureteral stent removal Procedure Note:  Indication: s/p Left URS/LL on 10/17/23, stent with string placed.***  After informed consent and discussion of the procedure and its risks, Colleen Phillips was positioned and prepped in the standard fashion. Cystoscopy was performed with a flexible cystoscope. The external vaginal anatomy, urethra, bladder neck and bladder mucosa were visualized in a systematic fashion. The ureteral orifices were noted in orthotopic location and orientation. There were no bladder mucosal lesions, stones, debris or anatomic variants noted.   Imaging: Recent {bglistimagingtype:33376} reviewed - no GU abnormalities noted  Findings: ***  Assessment and Plan: ***  Penne Skye, MD 10/22/2023

## 2023-10-23 ENCOUNTER — Encounter: Admitting: Urology

## 2023-10-24 ENCOUNTER — Ambulatory Visit: Admitting: Urology

## 2023-10-24 ENCOUNTER — Encounter: Payer: Self-pay | Admitting: Urology

## 2023-10-24 ENCOUNTER — Other Ambulatory Visit: Payer: Self-pay

## 2023-10-24 ENCOUNTER — Emergency Department

## 2023-10-24 ENCOUNTER — Emergency Department
Admission: EM | Admit: 2023-10-24 | Discharge: 2023-10-24 | Disposition: A | Discharge status: Home / Self Care | Attending: Emergency Medicine | Admitting: Emergency Medicine

## 2023-10-24 ENCOUNTER — Encounter: Payer: Self-pay | Admitting: Emergency Medicine

## 2023-10-24 VITALS — BP 129/89 | HR 76 | Ht 66.0 in | Wt 155.8 lb

## 2023-10-24 DIAGNOSIS — I10 Essential (primary) hypertension: Secondary | ICD-10-CM | POA: Insufficient documentation

## 2023-10-24 DIAGNOSIS — N132 Hydronephrosis with renal and ureteral calculous obstruction: Secondary | ICD-10-CM | POA: Insufficient documentation

## 2023-10-24 DIAGNOSIS — N201 Calculus of ureter: Secondary | ICD-10-CM

## 2023-10-24 DIAGNOSIS — N23 Unspecified renal colic: Secondary | ICD-10-CM

## 2023-10-24 DIAGNOSIS — R109 Unspecified abdominal pain: Secondary | ICD-10-CM | POA: Diagnosis present

## 2023-10-24 LAB — CBC
HCT: 43.8 % (ref 36.0–46.0)
Hemoglobin: 14.2 g/dL (ref 12.0–15.0)
MCH: 28.7 pg (ref 26.0–34.0)
MCHC: 32.4 g/dL (ref 30.0–36.0)
MCV: 88.5 fL (ref 80.0–100.0)
Platelets: 207 K/uL (ref 150–400)
RBC: 4.95 MIL/uL (ref 3.87–5.11)
RDW: 12.8 % (ref 11.5–15.5)
WBC: 7.3 K/uL (ref 4.0–10.5)
nRBC: 0 % (ref 0.0–0.2)

## 2023-10-24 LAB — URINALYSIS, ROUTINE W REFLEX MICROSCOPIC
Bilirubin Urine: NEGATIVE
Glucose, UA: NEGATIVE mg/dL
Ketones, ur: NEGATIVE mg/dL
Nitrite: NEGATIVE
Protein, ur: 100 mg/dL — AB
Specific Gravity, Urine: 1.006 (ref 1.005–1.030)
Squamous Epithelial / HPF: 0 /HPF (ref 0–5)
WBC, UA: >50 WBC/hpf (ref 0–5)
pH: 6 (ref 5.0–8.0)

## 2023-10-24 LAB — BASIC METABOLIC PANEL WITH GFR
Anion gap: 17 — ABNORMAL HIGH (ref 5–15)
BUN: 17 mg/dL (ref 6–20)
CO2: 22 mmol/L (ref 22–32)
Calcium: 9.4 mg/dL (ref 8.9–10.3)
Chloride: 103 mmol/L (ref 98–111)
Creatinine, Ser: 0.95 mg/dL (ref 0.44–1.00)
GFR, Estimated: >60 mL/min (ref >60)
Glucose, Bld: 106 mg/dL — ABNORMAL HIGH (ref 70–99)
Potassium: 4 mmol/L (ref 3.5–5.1)
Sodium: 142 mmol/L (ref 135–145)

## 2023-10-24 LAB — URINALYSIS, COMPLETE
Bilirubin, UA: NEGATIVE
Glucose, UA: NEGATIVE
Ketones, UA: NEGATIVE
Nitrite, UA: NEGATIVE
Specific Gravity, UA: 1.025 (ref 1.005–1.030)
Urobilinogen, Ur: 0.2 mg/dL (ref 0.2–1.0)
pH, UA: 6 (ref 5.0–7.5)

## 2023-10-24 LAB — MICROSCOPIC EXAMINATION
RBC, Urine: >30 /HPF — AB (ref 0–2)
WBC, UA: >30 /HPF — AB (ref 0–5)

## 2023-10-24 MED ORDER — HYDROCODONE-ACETAMINOPHEN 5-325 MG PO TABS: 1.0000 | ORAL_TABLET | Freq: Four times a day (QID) | ORAL | 0 refills | Status: AC | PRN

## 2023-10-24 MED ORDER — OXYCODONE HCL 5 MG PO TABS
5.0000 mg | ORAL_TABLET | Freq: Once | ORAL | Status: AC
  Administered 2023-10-24: 5 mg via ORAL
  Filled 2023-10-24: qty 1

## 2023-10-24 MED ORDER — KETOROLAC TROMETHAMINE 30 MG/ML IJ SOLN
30.0000 mg | Freq: Once | INTRAMUSCULAR | Status: AC
  Administered 2023-10-24: 30 mg via INTRAVENOUS
  Filled 2023-10-24: qty 1

## 2023-10-24 MED ORDER — LEVOFLOXACIN 500 MG PO TABS: 500.0000 mg | ORAL_TABLET | Freq: Every day | ORAL | 0 refills | Status: AC

## 2023-10-24 MED ORDER — LEVOFLOXACIN 500 MG PO TABS
500.0000 mg | ORAL_TABLET | Freq: Once | ORAL | Status: AC
  Administered 2023-10-24: 500 mg via ORAL

## 2023-10-24 MED ORDER — MORPHINE SULFATE (PF) 4 MG/ML IV SOLN
4.0000 mg | Freq: Once | INTRAVENOUS | Status: AC
  Administered 2023-10-24: 4 mg via INTRAVENOUS
  Filled 2023-10-24: qty 1

## 2023-10-24 MED ORDER — ONDANSETRON HCL 4 MG/2ML IJ SOLN
4.0000 mg | Freq: Once | INTRAMUSCULAR | Status: AC
  Administered 2023-10-24: 4 mg via INTRAVENOUS
  Filled 2023-10-24: qty 2

## 2023-10-24 NOTE — Discharge Instructions (Signed)
 Please stay well-hydrated.  While taking the pain medication, do not drive, operate machinery, or perform any dangerous activities for at least 8 hours after the last dose.  Return to the emergency department if change or worsen and you are unable to your primary care or urologist right away.

## 2023-10-24 NOTE — ED Triage Notes (Signed)
 Patient to ED via POV for left sided flank pain. Started last week. Had stent placed and was removed today- pain started again after the removal.

## 2023-10-24 NOTE — ED Provider Notes (Signed)
 [facility] Provider Note    Event Date/Time   First MD Initiated Contact with Patient 10/24/23 1556     (approximate)   History   Flank Pain   HPI  Colleen Phillips is a 53 y.o. female with history of hypertension, anxiety and as listed in EMR presents to the emergency department for treatment and evaluation of left-sided flank pain that started last week.  She underwent cystoscopy on the 19th with laser lithotripsy and left ureteral stent placement. Stent removed in office today via cystoscopy. All was well until around 2:00 when she developed sudden onset of left flank pain that again feels like kidney stone. She has not noted any bright red blood in the urine. She was given Levaquin  today post procedure. She has felt some nausea today, but denies vomiting. No known fever.    Physical Exam    Vitals:   10/24/23 1445  BP: (!) 156/89  Pulse: 95  Resp: 17  Temp: 98.3 F (36.8 C)  SpO2: 100%    General: Awake, no distress.  CV:  Good peripheral perfusion.  Resp:  Normal effort.  Abd:  No distention.  Other:  Left side CVA tenderness.   ED Results / Procedures / Treatments   Labs (all labs ordered are listed, but only abnormal results are displayed)  Labs Reviewed  URINALYSIS, ROUTINE W REFLEX MICROSCOPIC - Abnormal; Notable for the following components:      Result Value   Color, Urine YELLOW (*)    APPearance CLEAR (*)    Hgb urine dipstick MODERATE (*)    Protein, ur 100 (*)    Leukocytes,Ua SMALL (*)    Bacteria, UA RARE (*)    All other components within normal limits  BASIC METABOLIC PANEL WITH GFR - Abnormal; Notable for the following components:   Glucose, Bld 106 (*)    Anion gap 17 (*)    All other components within normal limits  CBC     EKG  Not indicated.   RADIOLOGY  Image and radiology report reviewed and interpreted by me. Radiology report consistent with the same.  CT renal stone study shows left renal  hydronephrosis and hydroureter without obstructing stone.  Distal ureteral stone is no longer present.  Gas in the bladder likely from recent instrumentation.  PROCEDURES:  Critical Care performed: No  Procedures   MEDICATIONS ORDERED IN ED:  Medications  oxyCODONE  (Oxy IR/ROXICODONE ) immediate release tablet 5 mg (has no administration in time range)  morphine  (PF) 4 MG/ML injection 4 mg (4 mg Intravenous Given 10/24/23 1659)  ondansetron  (ZOFRAN ) injection 4 mg (4 mg Intravenous Given 10/24/23 1657)  ketorolac  (TORADOL ) 30 MG/ML injection 30 mg (30 mg Intravenous Given 10/24/23 1826)     IMPRESSION / MDM / ASSESSMENT AND PLAN / ED COURSE   I have reviewed the triage note and vital signs. Vital signs stable.   Differential diagnosis includes, but is not limited to, renal colic, ureterolithiasis, ureteral obstruction  Patient's presentation is most consistent with acute illness / injury with system symptoms.  53 year old female presenting to the emergency department for treatment and evaluation of acute onset left flank pain started this afternoon after having stent removed.  See HPI for further details.  CT shows hydronephrosis and hydroureter on the left side but without obstruction.  CBC is normal.  BMP is overall reassuring.  Urinalysis shows moderate amount of hemoglobin with protein, small number of leukocytes, greater than 50 white blood cells and  rare bacteria.    Outpatient record reviewed.  Urinalysis results from earlier today are similar to that while here.  Plan will be to treat her with a few days of Levaquin  since she is now having flank pain.  Pain better controlled after morphine  and Toradol .  Prescription for Norco submitted to the patient's pharmacy in addition to the Levaquin .  She will be discharged with instruction to follow-up with urology when possible.  ER return precautions discussed as well.        FINAL CLINICAL IMPRESSION(S) / ED DIAGNOSES   Final  diagnoses:  Renal colic on left side     Rx / DC Orders   ED Discharge Orders          Ordered    HYDROcodone -acetaminophen  (NORCO/VICODIN) 5-325 MG tablet  Every 6 hours PRN        10/24/23 1751    levofloxacin  (LEVAQUIN ) 500 MG tablet  Daily        10/24/23 1751             Note:  This document was prepared using Dragon voice recognition software and may include unintentional dictation errors.   Herlinda Kirk NOVAK, FNP 10/24/23 CLAIR    Floy Roberts, MD 10/25/23 [phone]

## 2023-10-28 ENCOUNTER — Telehealth: Payer: Self-pay

## 2023-10-28 NOTE — Telephone Encounter (Signed)
 Followed up with patient today after she left an after hours triage call. She was seen in the ED on 9/26 after having her stent pulled the day before. She was told her kidney was full of urine. She was given pain meds and flomax  to help facilitate that kidney draining. She stated she started urinating a lot after that and feels much better. Pt is schedule for follow up in a month.

## 2023-10-29 ENCOUNTER — Telehealth: Payer: Self-pay | Admitting: *Deleted

## 2023-10-29 NOTE — Telephone Encounter (Signed)
 Patient called in today and states she feel like she is going to the bathroom more often . She states she has no smell or odor , no cloudless. Seems like its taking along for her urine to come out. No fever or chili's . She just finished her Levofloxacin  yesterday . Patient has appt on 11/28/2023 for her follow up. Does she need a sooner appt.

## 2023-10-30 NOTE — Telephone Encounter (Signed)
 Please call and reassess her symptoms.  If she is having any painful urination or flank pain, please offer her an appointment with me or Clotilda next week.  Otherwise she may keep follow-up as scheduled.

## 2023-10-31 NOTE — Telephone Encounter (Signed)
 Spoke with patient and she states She did not call in yesterday, she called Monday and she already spoke with someone from the office. She is no longer having any symptoms.

## 2023-11-04 ENCOUNTER — Other Ambulatory Visit: Payer: Self-pay

## 2023-11-14 ENCOUNTER — Other Ambulatory Visit: Payer: Self-pay

## 2023-11-14 MED ORDER — NITROFURANTOIN MONOHYD MACRO 100 MG PO CAPS
100.0000 mg | ORAL_CAPSULE | Freq: Two times a day (BID) | ORAL | 0 refills | Status: AC
  Filled 2023-11-14: qty 10, 5d supply, fill #0

## 2023-11-17 ENCOUNTER — Ambulatory Visit

## 2023-11-21 DIAGNOSIS — N200 Calculus of kidney: Secondary | ICD-10-CM | POA: Insufficient documentation

## 2023-11-21 NOTE — Assessment & Plan Note (Deleted)
 S/p Left URS/LL on 10/17/23   - 8mm left distal ureteral stone, significant impaction

## 2023-11-21 NOTE — Progress Notes (Deleted)
   11/28/2023 1:38 PM   Colleen Phillips 1970-05-20 [id number]  Reason for visit: Follow up nephrolithiasis    HPI: 53 y.o. female, follow up with me today RBUS ***  Prior HPI: S/p Left URS/LL on 10/17/23   - 8mm left distal ureteral stone, significant impaction    - 5-day stent   - ED visit after clinic stent removal, likely ureteral spasms    Physical Exam: There were no vitals taken for this visit.   Constitutional:  Alert and oriented, No acute distress.  Laboratory Data: N/A  Pertinent Imaging: I have personally viewed and interpreted the ***.    Assessment & Plan:    Nephrolithiasis Assessment & Plan: S/p Left URS/LL on 10/17/23   - 8mm left distal ureteral stone, significant impaction         Colleen JONELLE Skye, MD  [facility] [address] [place], [place] [address] [phone]

## 2023-11-25 ENCOUNTER — Telehealth: Payer: Self-pay | Admitting: Urology

## 2023-11-25 NOTE — Telephone Encounter (Signed)
 The patient called the office wanting to cancel her ultrasound scheduled for Friday. I provided her with the correct number to call for cancellation. She then stated that on the day her stent was removed, she had to go to the ER, where they performed an MRI. She was informed that she no longer had any kidney stones and that everything appeared clear.  The patient would like to know if she still needs to have the ultrasound done. If it is still necessary, she will contact the imaging department to reschedule; if not, she would like a call back to confirm either way.   She can be reached at [phone].

## 2023-11-26 ENCOUNTER — Other Ambulatory Visit: Payer: Self-pay

## 2023-11-28 ENCOUNTER — Ambulatory Visit

## 2023-11-28 ENCOUNTER — Ambulatory Visit: Admitting: Urology

## 2023-11-28 DIAGNOSIS — N200 Calculus of kidney: Secondary | ICD-10-CM

## 2023-12-08 ENCOUNTER — Other Ambulatory Visit: Payer: Self-pay

## 2023-12-10 ENCOUNTER — Other Ambulatory Visit: Payer: Self-pay

## 2023-12-11 ENCOUNTER — Other Ambulatory Visit: Payer: Self-pay

## 2023-12-11 MED ORDER — SERTRALINE HCL 50 MG PO TABS
150.0000 mg | ORAL_TABLET | Freq: Every day | ORAL | 3 refills | Status: AC
  Filled 2023-12-11: qty 270, 90d supply, fill #0

## 2023-12-12 ENCOUNTER — Ambulatory Visit
Admission: RE | Admit: 2023-12-12 | Discharge: 2023-12-12 | Disposition: A | Discharge status: Home / Self Care | Source: Ambulatory Visit | Attending: Family Medicine | Admitting: Family Medicine

## 2023-12-12 DIAGNOSIS — Z1231 Encounter for screening mammogram for malignant neoplasm of breast: Secondary | ICD-10-CM | POA: Diagnosis present

## 2024-01-02 ENCOUNTER — Other Ambulatory Visit: Payer: Self-pay

## 2024-01-03 ENCOUNTER — Other Ambulatory Visit: Payer: Self-pay

## 2024-01-05 ENCOUNTER — Other Ambulatory Visit (HOSPITAL_COMMUNITY): Payer: Self-pay

## 2024-01-06 NOTE — Progress Notes (Unsigned)
   01/07/2024 11:08 AM   [name] 1971-01-14 [id number]  Reason for visit: Follow up nephrolithiasis   HPI: 53 y.o. female, follow up with me today  Prior HPI: S/p Left distal URS/LL on 10/17/23  - significant impaction/inflammation - 5 day ureteral stent placed  - cystoscopic stent removal 9/26- string fell off    Physical Exam: There were no vitals taken for this visit.   Constitutional:  Alert and oriented, No acute distress.  Laboratory Data: N/A  Pertinent Imaging: I have personally viewed and interpreted the CT Stone (10/24/23)  IMPRESSION: 1. Left renal hydronephrosis and hydroureter. No obstructing stone is demonstrated. Previous distal ureteral stone is no longer present, presumably passed or removed in the interval. Mild stranding around the distal ureter. This could indicate stricture or infection or may be sequela of recently passed stone. 2. Gas in the bladder likely arises from history of recent instrumentation. 3. Liquid stool in the colon..    Assessment & Plan:    Nephrolithiasis Assessment & Plan: S/p Left distal URS/LL on 10/17/23  - significant impaction/inflammation - 5 day ureteral stent placed  - cystoscopic stent removal 9/26- string fell off  - Schedule previously ordered RBUS        Colleen JONELLE Skye, MD  [facility] [address] [place], [place] [address] [phone]

## 2024-01-06 NOTE — Assessment & Plan Note (Signed)
 S/p Left distal URS/LL on 10/17/23  - significant impaction/inflammation - 5 day ureteral stent placed  - cystoscopic stent removal 9/26- string fell off  - Schedule previously ordered RBUS

## 2024-01-07 ENCOUNTER — Ambulatory Visit: Admitting: Urology

## 2024-01-07 VITALS — BP 126/64 | HR 73 | Wt 156.0 lb

## 2024-01-07 DIAGNOSIS — N200 Calculus of kidney: Secondary | ICD-10-CM

## 2024-01-07 NOTE — Patient Instructions (Signed)
 Please cal central scheduling at [phone]

## 2024-01-13 NOTE — Addendum Note (Signed)
 Addended by: Rockelle Heuerman A on: 01/13/2024 07:54 AM   Modules accepted: Orders

## 2024-01-15 ENCOUNTER — Other Ambulatory Visit: Payer: Self-pay

## 2024-01-15 ENCOUNTER — Other Ambulatory Visit (HOSPITAL_COMMUNITY): Payer: Self-pay

## 2024-01-16 ENCOUNTER — Other Ambulatory Visit: Payer: Self-pay

## 2024-01-20 ENCOUNTER — Other Ambulatory Visit: Payer: Self-pay

## 2024-01-20 MED ORDER — SUMATRIPTAN SUCCINATE 25 MG PO TABS
25.0000 mg | ORAL_TABLET | ORAL | 0 refills | Status: DC | PRN
  Filled 2024-01-20: qty 1, 2d supply, fill #0

## 2024-01-23 ENCOUNTER — Other Ambulatory Visit: Payer: Self-pay

## 2024-01-23 ENCOUNTER — Other Ambulatory Visit (HOSPITAL_COMMUNITY): Payer: Self-pay

## 2024-01-23 MED ORDER — SUMATRIPTAN SUCCINATE 25 MG PO TABS
25.0000 mg | ORAL_TABLET | ORAL | 0 refills | Status: AC | PRN
  Filled 2024-02-26: qty 1, 2d supply, fill #0

## 2024-01-26 ENCOUNTER — Other Ambulatory Visit: Payer: Self-pay

## 2024-01-26 MED ORDER — HYDROCORTISONE ACETATE 25 MG RE SUPP
25.0000 mg | Freq: Two times a day (BID) | RECTAL | 0 refills | Status: AC
  Filled 2024-01-26: qty 20, 10d supply, fill #0
  Filled 2024-01-27: qty 5, 3d supply, fill #0
  Filled 2024-01-27 (×2): qty 20, 10d supply, fill #0

## 2024-01-26 MED ORDER — AMOXICILLIN-POT CLAVULANATE 875-125 MG PO TABS
1.0000 | ORAL_TABLET | Freq: Two times a day (BID) | ORAL | 0 refills | Status: AC
  Filled 2024-01-26: qty 20, 10d supply, fill #0

## 2024-01-26 MED ORDER — SUMATRIPTAN SUCCINATE 25 MG PO TABS
25.0000 mg | ORAL_TABLET | ORAL | 2 refills | Status: AC | PRN
  Filled 2024-01-26: qty 9, 30d supply, fill #0
  Filled 2024-01-27: qty 9, 18d supply, fill #0
  Filled 2024-02-27: qty 9, 18d supply, fill #1

## 2024-01-27 ENCOUNTER — Other Ambulatory Visit: Payer: Self-pay

## 2024-01-28 ENCOUNTER — Other Ambulatory Visit: Payer: Self-pay

## 2024-02-04 ENCOUNTER — Other Ambulatory Visit: Payer: Self-pay

## 2024-02-04 MED ORDER — ESTRADIOL 0.05 MG/24HR TD PTTW
1.0000 | MEDICATED_PATCH | TRANSDERMAL | 11 refills | Status: AC
  Filled 2024-02-04: qty 8, 28d supply, fill #0

## 2024-02-04 MED ORDER — ESTRADIOL 0.01 % VA CREA
0.5000 g | TOPICAL_CREAM | VAGINAL | 3 refills | Status: AC
  Filled 2024-02-04: qty 42.5, 90d supply, fill #0

## 2024-02-09 ENCOUNTER — Other Ambulatory Visit: Payer: Self-pay

## 2024-02-09 MED ORDER — PANTOPRAZOLE SODIUM 40 MG PO TBEC
40.0000 mg | DELAYED_RELEASE_TABLET | Freq: Every day | ORAL | 0 refills | Status: AC
  Filled 2024-02-09: qty 90, 90d supply, fill #0

## 2024-02-26 ENCOUNTER — Other Ambulatory Visit: Payer: Self-pay

## 2024-02-26 MED ORDER — SUMATRIPTAN SUCCINATE 25 MG PO TABS
ORAL_TABLET | ORAL | 0 refills | Status: AC
  Filled 2024-02-27: qty 1, 3d supply, fill #0

## 2024-02-27 ENCOUNTER — Other Ambulatory Visit: Payer: Self-pay

## 2024-03-03 ENCOUNTER — Other Ambulatory Visit: Payer: Self-pay

## 2024-03-03 MED ORDER — METHOTREXATE SODIUM 2.5 MG PO TABS
12.5000 mg | ORAL_TABLET | ORAL | 2 refills | Status: AC
  Filled 2024-03-03: qty 20, 28d supply, fill #0

## 2024-03-03 MED ORDER — FOLIC ACID 1 MG PO TABS
1.0000 mg | ORAL_TABLET | Freq: Every day | ORAL | 3 refills | Status: AC
  Filled 2024-03-03: qty 90, 90d supply, fill #0
